# Patient Record
Sex: Female | Born: 1980 | Race: White | Hispanic: No | Marital: Single | Smoking: Current every day smoker
Health system: Southern US, Community
[De-identification: ages and names within clinical notes are randomized; demographics above are authoritative.]

## PROBLEM LIST (undated history)

## (undated) ENCOUNTER — Inpatient Hospital Stay: Payer: Self-pay

## (undated) DIAGNOSIS — R519 Headache, unspecified: Secondary | ICD-10-CM

## (undated) DIAGNOSIS — F329 Major depressive disorder, single episode, unspecified: Secondary | ICD-10-CM

## (undated) DIAGNOSIS — R51 Headache: Secondary | ICD-10-CM

## (undated) DIAGNOSIS — R87629 Unspecified abnormal cytological findings in specimens from vagina: Secondary | ICD-10-CM

## (undated) DIAGNOSIS — F32A Depression, unspecified: Secondary | ICD-10-CM

## (undated) DIAGNOSIS — E039 Hypothyroidism, unspecified: Secondary | ICD-10-CM

## (undated) HISTORY — PX: CHOLECYSTECTOMY: SHX55

---

## 2013-08-27 ENCOUNTER — Emergency Department: Payer: Self-pay | Admitting: Emergency Medicine

## 2013-08-31 LAB — WOUND AEROBIC CULTURE

## 2014-01-30 HISTORY — PX: MINI-LAPAROTOMY W/ TUBAL LIGATION: SUR811

## 2014-08-27 LAB — OB RESULTS CONSOLE VARICELLA ZOSTER ANTIBODY, IGG: Varicella: IMMUNE

## 2014-08-27 LAB — OB RESULTS CONSOLE HIV ANTIBODY (ROUTINE TESTING): HIV: NONREACTIVE

## 2014-08-27 LAB — OB RESULTS CONSOLE GC/CHLAMYDIA
CHLAMYDIA, DNA PROBE: NEGATIVE
CHLAMYDIA, DNA PROBE: NEGATIVE
CHLAMYDIA, DNA PROBE: NEGATIVE
GC PROBE AMP, GENITAL: NEGATIVE

## 2014-08-27 LAB — OB RESULTS CONSOLE ABO/RH: RH Type: POSITIVE

## 2014-08-27 LAB — OB RESULTS CONSOLE RUBELLA ANTIBODY, IGM: Rubella: IMMUNE

## 2014-08-27 LAB — OB RESULTS CONSOLE RPR: RPR: NONREACTIVE

## 2014-08-27 LAB — OB RESULTS CONSOLE HEPATITIS B SURFACE ANTIGEN: Hepatitis B Surface Ag: NEGATIVE

## 2014-08-27 LAB — OB RESULTS CONSOLE ANTIBODY SCREEN: Antibody Screen: NEGATIVE

## 2014-10-07 ENCOUNTER — Encounter: Payer: Self-pay | Admitting: *Deleted

## 2014-10-07 ENCOUNTER — Inpatient Hospital Stay
Admission: EM | Admit: 2014-10-07 | Discharge: 2014-10-07 | Disposition: A | Discharge status: Home / Self Care | Payer: Medicaid Other | Attending: Obstetrics and Gynecology | Admitting: Obstetrics and Gynecology

## 2014-10-07 DIAGNOSIS — Z3A23 23 weeks gestation of pregnancy: Secondary | ICD-10-CM | POA: Diagnosis not present

## 2014-10-07 DIAGNOSIS — O368120 Decreased fetal movements, second trimester, not applicable or unspecified: Secondary | ICD-10-CM | POA: Diagnosis present

## 2014-10-07 HISTORY — DX: Headache: R51

## 2014-10-07 HISTORY — DX: Headache, unspecified: R51.9

## 2014-10-07 HISTORY — DX: Unspecified abnormal cytological findings in specimens from vagina: R87.629

## 2014-10-07 NOTE — Progress Notes (Signed)
Pt arrived to OBS4 via wheelchair with complaints of decreased fetal movement all day. Pt reports feeling some movements but not a lot. Pt denies any leaking of fluid or vaginal bleeding.

## 2014-10-07 NOTE — Progress Notes (Signed)
Pt discharged home in stable and ambulatory condition with significant other. Pt given discharge instructions and instructed to follow up as soon as possible with the health department for ultrasound. Pt denies any questions or concerns.

## 2014-10-07 NOTE — OB Triage Note (Signed)
Follow up with [facility] department. Call tomorrow to schedule appt.

## 2014-10-07 NOTE — Progress Notes (Signed)
Dr Valentino Saxon notified of FHR tracing and fetal movement present now. Orders received for discharge home with instructions to follow up as soon as possible with [facility] for ultrasound and dating.

## 2014-10-22 ENCOUNTER — Other Ambulatory Visit: Payer: Self-pay | Admitting: Advanced Practice Midwife

## 2014-10-22 DIAGNOSIS — F17200 Nicotine dependence, unspecified, uncomplicated: Secondary | ICD-10-CM

## 2014-10-22 DIAGNOSIS — E039 Hypothyroidism, unspecified: Secondary | ICD-10-CM

## 2014-11-03 ENCOUNTER — Ambulatory Visit
Admission: RE | Admit: 2014-11-03 | Discharge: 2014-11-03 | Disposition: A | Discharge status: Home / Self Care | Payer: Medicaid Other | Source: Ambulatory Visit | Attending: Advanced Practice Midwife | Admitting: Advanced Practice Midwife

## 2014-11-03 ENCOUNTER — Ambulatory Visit
Admission: RE | Admit: 2014-11-03 | Discharge: 2014-11-03 | Disposition: A | Discharge status: Home / Self Care | Payer: Medicaid Other | Source: Ambulatory Visit | Attending: Obstetrics and Gynecology | Admitting: Obstetrics and Gynecology

## 2014-11-03 VITALS — BP 115/65 | HR 69 | Temp 98.0°F | Ht 65.0 in | Wt 165.0 lb

## 2014-11-03 DIAGNOSIS — B977 Papillomavirus as the cause of diseases classified elsewhere: Secondary | ICD-10-CM

## 2014-11-03 DIAGNOSIS — O99332 Smoking (tobacco) complicating pregnancy, second trimester: Secondary | ICD-10-CM | POA: Diagnosis present

## 2014-11-03 DIAGNOSIS — R8789 Other abnormal findings in specimens from female genital organs: Secondary | ICD-10-CM | POA: Diagnosis not present

## 2014-11-03 DIAGNOSIS — F329 Major depressive disorder, single episode, unspecified: Secondary | ICD-10-CM | POA: Insufficient documentation

## 2014-11-03 DIAGNOSIS — Z87898 Personal history of other specified conditions: Secondary | ICD-10-CM | POA: Insufficient documentation

## 2014-11-03 DIAGNOSIS — D696 Thrombocytopenia, unspecified: Secondary | ICD-10-CM | POA: Diagnosis not present

## 2014-11-03 DIAGNOSIS — Z3A26 26 weeks gestation of pregnancy: Secondary | ICD-10-CM | POA: Insufficient documentation

## 2014-11-03 DIAGNOSIS — E039 Hypothyroidism, unspecified: Secondary | ICD-10-CM

## 2014-11-03 DIAGNOSIS — F17200 Nicotine dependence, unspecified, uncomplicated: Secondary | ICD-10-CM

## 2014-11-03 DIAGNOSIS — Z720 Tobacco use: Secondary | ICD-10-CM

## 2014-11-03 DIAGNOSIS — IMO0002 Reserved for concepts with insufficient information to code with codable children: Secondary | ICD-10-CM

## 2014-11-03 HISTORY — DX: Major depressive disorder, single episode, unspecified: F32.9

## 2014-11-03 HISTORY — DX: Depression, unspecified: F32.A

## 2014-11-03 HISTORY — DX: Hypothyroidism, unspecified: E03.9

## 2014-11-03 MED ORDER — SERTRALINE HCL 25 MG PO TABS: 25.0000 mg | ORAL_TABLET | Freq: Every day | ORAL | Status: DC

## 2014-11-03 NOTE — Progress Notes (Signed)
[facility] Consultation   Chief Complaint: Advice regarding history of FGR in prior pregnancies and thrombocytopenia.  HPI: Rachel Baird is a 34 y.o. [phone] at 4325w4d by today's ultrasound (uncertain LMP) who presents in consultation from the [facility] for recommendations regarding history of FGR and thrombocytopenia.  She denies nose bleeds, gum bleeding, heavy menses or abnormalities with postpartum bleeding.  She does note easier bruising recently.  She is not taking any medications that are known to affect platelets.  Past Medical History: Patient  has a past medical history of Vaginal Pap smear, abnormal; Headache; Hypothyroidism; and Depression.  Past Surgical History: She  has past surgical history that includes Cholecystectomy.  Obstetric History:  OB History    Gravida Para Term Preterm AB TAB SAB Ectopic Multiple Living   4 3 3  0      3    2008 female 5#8oz [id number] female 6# 2013 female 5#9oz 2016 current FOB for the 2013 and 2016 pregnancies is different from the 2008 and 2010 pregnancies. Gynecologic History:  Patient's last menstrual period was 05/04/2014 (within months).  Hx of abnormal pap smears: yes Last pap smear was abnormal: ASCUS with +HPV .  She had colposcopy after her first abnormal PAP as a teen - she is scheduled for colpo PP.  No history of LEEPs or cone biopsies.  Medications: She takes PNVs. Allergies: Patient is allergic to minocycline.  Social History: Patient  reports that she has been smoking Cigarettes.  She has a 2.5 pack-year smoking history. She does not have any smokeless tobacco history on file. She reports that she uses illicit drugs (Marijuana). She reports that she does not drink alcohol. She is in a relationship with the father of this pregnancy and her most recent pregnancy.  She is not currently working.   Family History: family history includes COPD in her father and mother; Cancer in her father; Diabetes in her paternal grandfather  and paternal grandmother; Heart disease in her mother.  Review of Systems A full 12 point review of systems was negative or as noted in the History of Present Illness.  Physical Exam: BP 115/65 mmHg  Pulse 69  Temp(Src) 98 F (36.7 C)  Ht 5\' 5"  (1.651 m)  Wt 165 lb (74.844 kg)  BMI 27.46 kg/m2  LMP 05/04/2014 (Within Months)  Breastfeeding? Unknown   Asessement: 1. Thrombocytopenia (HCC)   2. Smoker   3. Hypothyroidism, unspecified hypothyroidism type   4. History of poor fetal growth   5. Abnormal cervical Pap smear with positive HPV DNA test   6.  Depression  Plan: 1.  Thrombocytopenia - will repeat cbc today.  If platelets remain low, consider further work up depending on level.  BP remains normal and patient denies any other symptoms concerning for ITP or preeclampsia.  Query gestational thrombocytopenia. 2.  Smoker - encouraged to try and cut back or quit.  Smoking aids available reviewed.  Discussed that smoking is probably the most common association with fetal growth restriction and can impact risk for stillbirth and/or SIDS. 3.  Hx of hypothyroidism as a teen - has been off synthroid for many years.  Intake TSH was normal. 4.  Hx of FGR x 2 - encouraged to cut back on smoking.  Recommend serial ultrasounds for growth (next scheduled in 4 weeks). 5.  Hx of abnormal PAP - colpo PP. 6.  Depression - patient hasn't been on meds (zoloft) since a teen.  When asked about her mood, she admitted  to being depressed.  She continues with self care and is able to care for her son, but mood is labile.  She denies SI or HI.  Has been on zoloft in the past and would like to restart.  Provided a script for 25 mg and instructed to double the dose in 1-2 weeks and if still no effect, to double again.  Will need to be reevaluated after she reaches 50-100mg  for effect.  We discussed that risk for depression was greatest postpartum.  She currently (per her records) does not have custody of her 2  oldest children and we did not discuss the issues or reasoning related to this.  Consider counseling.  Total time spent with the patient was 30 minutes with greater than 50% spent in counseling and coordination of care. We appreciate this interesting consult and will be happy to be involved in the ongoing care of Ms. Parsley in anyway her obstetricians desire.  Kirby Funk, MD Maternal-Fetal Medicine [place]

## 2014-11-03 NOTE — Addendum Note (Signed)
Encounter addended by: Kirby FunkSarah Alexandr Oehler, MD on: 11/03/2014  4:30 PM<BR>     Documentation filed: Notes Section

## 2014-11-03 NOTE — Progress Notes (Signed)
Remote history of gonorrhea in 2013 - treated.

## 2014-12-01 ENCOUNTER — Ambulatory Visit
Admission: RE | Admit: 2014-12-01 | Discharge: 2014-12-01 | Disposition: A | Discharge status: Home / Self Care | Payer: Medicaid Other | Source: Ambulatory Visit | Attending: Maternal & Fetal Medicine | Admitting: Maternal & Fetal Medicine

## 2014-12-01 ENCOUNTER — Other Ambulatory Visit: Payer: Self-pay | Admitting: Obstetrics and Gynecology

## 2014-12-01 VITALS — BP 125/67 | HR 65 | Temp 97.7°F | Wt 168.0 lb

## 2014-12-01 DIAGNOSIS — E039 Hypothyroidism, unspecified: Secondary | ICD-10-CM | POA: Insufficient documentation

## 2014-12-01 DIAGNOSIS — Z3A30 30 weeks gestation of pregnancy: Secondary | ICD-10-CM | POA: Diagnosis not present

## 2014-12-01 DIAGNOSIS — O99333 Smoking (tobacco) complicating pregnancy, third trimester: Secondary | ICD-10-CM | POA: Insufficient documentation

## 2014-12-01 DIAGNOSIS — O99283 Endocrine, nutritional and metabolic diseases complicating pregnancy, third trimester: Secondary | ICD-10-CM | POA: Insufficient documentation

## 2014-12-01 DIAGNOSIS — O99113 Other diseases of the blood and blood-forming organs and certain disorders involving the immune mechanism complicating pregnancy, third trimester: Secondary | ICD-10-CM | POA: Diagnosis not present

## 2014-12-01 DIAGNOSIS — O365930 Maternal care for other known or suspected poor fetal growth, third trimester, not applicable or unspecified: Secondary | ICD-10-CM | POA: Insufficient documentation

## 2014-12-01 DIAGNOSIS — D696 Thrombocytopenia, unspecified: Secondary | ICD-10-CM

## 2014-12-01 DIAGNOSIS — Z87898 Personal history of other specified conditions: Secondary | ICD-10-CM

## 2014-12-01 DIAGNOSIS — IMO0002 Reserved for concepts with insufficient information to code with codable children: Secondary | ICD-10-CM

## 2014-12-29 ENCOUNTER — Ambulatory Visit: Payer: Medicaid Other

## 2015-01-01 ENCOUNTER — Ambulatory Visit: Payer: Medicaid Other

## 2015-01-01 ENCOUNTER — Other Ambulatory Visit: Payer: Self-pay

## 2015-01-01 ENCOUNTER — Inpatient Hospital Stay (HOSPITAL_BASED_OUTPATIENT_CLINIC_OR_DEPARTMENT_OTHER)
Admission: RE | Admit: 2015-01-01 | Discharge: 2015-01-01 | Disposition: A | Discharge status: Home / Self Care | Payer: Medicaid Other | Source: Ambulatory Visit

## 2015-01-01 DIAGNOSIS — D696 Thrombocytopenia, unspecified: Secondary | ICD-10-CM | POA: Insufficient documentation

## 2015-01-01 DIAGNOSIS — Z8759 Personal history of other complications of pregnancy, childbirth and the puerperium: Secondary | ICD-10-CM

## 2015-01-01 DIAGNOSIS — Z87898 Personal history of other specified conditions: Secondary | ICD-10-CM

## 2015-01-01 NOTE — Progress Notes (Signed)
[facility]: Pt did not keep her appt with us today  Chandlar Staebell, [place] A, MD

## 2015-01-11 NOTE — L&D Delivery Note (Signed)
Delivery Note At 8:14 PM a viable female was delivered via Vaginal, Spontaneous Delivery (Presentation: direct OA ).  APGAR: 8, 9; weight  .   Placenta status: spontaneous, intact .  Cord: 3 vessels,  with the following complications: none apparent .  Cord pH:n/a  Anesthesia: Epidural  Episiotomy: None Lacerations: none Suture Repair: n/a Est. Blood Loss (mL):  200cc  Mom to postpartum.  Baby to Couplet care / Skin to Skin.  Patient was augmented with pitocin and progressed to complete, with fetal station at +3.  Patient pushed 2 small pushes, and fetal head was delivered with loose nuchal cord, reduced at perineum.  Body was delivered and placed on mom's chest.  No lacerations.  After >60 seconds of delayed cord clamping, the cord was doubly clamped and cut by FOB.  We sang happy birthday to [place].  The placenta was delivered spontaneously and intact.  Small periuretheral rug burn was noted, but was hemostatic and no suture needed. Baby recovered skin-to-skin on mom's chest.  Coriann Brouhard C 01/30/2015, 8:26 PM

## 2015-01-19 ENCOUNTER — Institutional Professional Consult (permissible substitution): Payer: Medicaid Other

## 2015-01-28 LAB — OB RESULTS CONSOLE GC/CHLAMYDIA
CHLAMYDIA, DNA PROBE: NEGATIVE
GC PROBE AMP, GENITAL: NEGATIVE

## 2015-01-30 ENCOUNTER — Inpatient Hospital Stay: Payer: Medicaid Other | Admitting: Anesthesiology

## 2015-01-30 ENCOUNTER — Encounter: Payer: Self-pay | Admitting: Obstetrics & Gynecology

## 2015-01-30 ENCOUNTER — Inpatient Hospital Stay
Admission: EM | Admit: 2015-01-30 | Discharge: 2015-02-02 | DRG: 767 | Disposition: A | Discharge status: Home / Self Care | Payer: Medicaid Other | Attending: Internal Medicine | Admitting: Internal Medicine

## 2015-01-30 DIAGNOSIS — O99334 Smoking (tobacco) complicating childbirth: Secondary | ICD-10-CM | POA: Diagnosis present

## 2015-01-30 DIAGNOSIS — F419 Anxiety disorder, unspecified: Secondary | ICD-10-CM | POA: Diagnosis present

## 2015-01-30 DIAGNOSIS — O99344 Other mental disorders complicating childbirth: Secondary | ICD-10-CM | POA: Diagnosis present

## 2015-01-30 DIAGNOSIS — F329 Major depressive disorder, single episode, unspecified: Secondary | ICD-10-CM | POA: Diagnosis present

## 2015-01-30 DIAGNOSIS — I10 Essential (primary) hypertension: Secondary | ICD-10-CM | POA: Diagnosis not present

## 2015-01-30 DIAGNOSIS — I973 Postprocedural hypertension: Secondary | ICD-10-CM | POA: Diagnosis not present

## 2015-01-30 DIAGNOSIS — F1290 Cannabis use, unspecified, uncomplicated: Secondary | ICD-10-CM | POA: Diagnosis present

## 2015-01-30 DIAGNOSIS — Z3A39 39 weeks gestation of pregnancy: Secondary | ICD-10-CM

## 2015-01-30 DIAGNOSIS — Z302 Encounter for sterilization: Secondary | ICD-10-CM

## 2015-01-30 DIAGNOSIS — R001 Bradycardia, unspecified: Secondary | ICD-10-CM | POA: Diagnosis not present

## 2015-01-30 DIAGNOSIS — O99324 Drug use complicating childbirth: Secondary | ICD-10-CM | POA: Diagnosis present

## 2015-01-30 DIAGNOSIS — D696 Thrombocytopenia, unspecified: Secondary | ICD-10-CM | POA: Diagnosis present

## 2015-01-30 DIAGNOSIS — O9912 Other diseases of the blood and blood-forming organs and certain disorders involving the immune mechanism complicating childbirth: Principal | ICD-10-CM | POA: Diagnosis present

## 2015-01-30 DIAGNOSIS — O99113 Other diseases of the blood and blood-forming organs and certain disorders involving the immune mechanism complicating pregnancy, third trimester: Secondary | ICD-10-CM | POA: Diagnosis present

## 2015-01-30 DIAGNOSIS — F17210 Nicotine dependence, cigarettes, uncomplicated: Secondary | ICD-10-CM | POA: Diagnosis present

## 2015-01-30 LAB — CBC
HEMATOCRIT: 37.9 % (ref 35.0–47.0)
HEMOGLOBIN: 12.3 g/dL (ref 12.0–16.0)
MCH: 28.2 pg (ref 26.0–34.0)
MCHC: 32.5 g/dL (ref 32.0–36.0)
MCV: 86.7 fL (ref 80.0–100.0)
Platelets: 115 10*3/uL — ABNORMAL LOW (ref 150–440)
RBC: 4.37 MIL/uL (ref 3.80–5.20)
RDW: 14 % (ref 11.5–14.5)
WBC: 14 10*3/uL — AB (ref 3.6–11.0)

## 2015-01-30 LAB — URINE DRUG SCREEN, QUALITATIVE (ARMC ONLY)
Amphetamines, Ur Screen: NOT DETECTED
BARBITURATES, UR SCREEN: NOT DETECTED
Benzodiazepine, Ur Scrn: NOT DETECTED
CANNABINOID 50 NG, UR ~~LOC~~: POSITIVE — AB
COCAINE METABOLITE, UR ~~LOC~~: NOT DETECTED
MDMA (ECSTASY) UR SCREEN: NOT DETECTED
Methadone Scn, Ur: NOT DETECTED
OPIATE, UR SCREEN: NOT DETECTED
PHENCYCLIDINE (PCP) UR S: NOT DETECTED
TRICYCLIC, UR SCREEN: NOT DETECTED

## 2015-01-30 LAB — TYPE AND SCREEN
ABO/RH(D): A POS
Antibody Screen: NEGATIVE

## 2015-01-30 LAB — ABO/RH: ABO/RH(D): A POS

## 2015-01-30 LAB — MRSA PCR SCREENING: MRSA BY PCR: NEGATIVE

## 2015-01-30 MED ORDER — PHENYLEPHRINE 40 MCG/ML (10ML) SYRINGE FOR IV PUSH (FOR BLOOD PRESSURE SUPPORT)
80.0000 ug | PREFILLED_SYRINGE | INTRAVENOUS | Status: DC | PRN
  Filled 2015-01-30: qty 2

## 2015-01-30 MED ORDER — OXYTOCIN 40 UNITS IN LACTATED RINGERS INFUSION - SIMPLE MED
1.0000 m[IU]/min | INTRAVENOUS | Status: DC
  Administered 2015-01-30: 1 m[IU]/min via INTRAVENOUS

## 2015-01-30 MED ORDER — LACTATED RINGERS IV SOLN
INTRAVENOUS | Status: DC
  Administered 2015-01-30 (×2): via INTRAVENOUS

## 2015-01-30 MED ORDER — LIDOCAINE-EPINEPHRINE (PF) 1.5 %-1:200000 IJ SOLN
INTRAMUSCULAR | Status: DC | PRN
  Administered 2015-01-30: 3 mL via EPIDURAL

## 2015-01-30 MED ORDER — CITRIC ACID-SODIUM CITRATE 334-500 MG/5ML PO SOLN: 30.0000 mL | ORAL | Status: DC | PRN

## 2015-01-30 MED ORDER — ONDANSETRON HCL 4 MG/2ML IJ SOLN: 4.0000 mg | INTRAMUSCULAR | Status: DC | PRN

## 2015-01-30 MED ORDER — BUPIVACAINE HCL (PF) 0.25 % IJ SOLN
INTRAMUSCULAR | Status: DC | PRN
  Administered 2015-01-30: 5 mL via EPIDURAL

## 2015-01-30 MED ORDER — DIBUCAINE 1 % RE OINT: 1.0000 "application " | TOPICAL_OINTMENT | RECTAL | Status: DC | PRN

## 2015-01-30 MED ORDER — LACTATED RINGERS IV SOLN: 500.0000 mL | INTRAVENOUS | Status: DC | PRN

## 2015-01-30 MED ORDER — OXYTOCIN 40 UNITS IN LACTATED RINGERS INFUSION - SIMPLE MED
2.5000 [IU]/h | INTRAVENOUS | Status: DC
  Administered 2015-01-30: 39.96 [IU]/h via INTRAVENOUS
  Filled 2015-01-30: qty 1000

## 2015-01-30 MED ORDER — DIPHENHYDRAMINE HCL 25 MG PO CAPS: 25.0000 mg | ORAL_CAPSULE | Freq: Four times a day (QID) | ORAL | Status: DC | PRN

## 2015-01-30 MED ORDER — LANOLIN HYDROUS EX OINT: TOPICAL_OINTMENT | CUTANEOUS | Status: DC | PRN

## 2015-01-30 MED ORDER — LIDOCAINE HCL (PF) 1 % IJ SOLN
30.0000 mL | INTRAMUSCULAR | Status: DC | PRN
  Filled 2015-01-30: qty 30

## 2015-01-30 MED ORDER — OXYCODONE-ACETAMINOPHEN 5-325 MG PO TABS: 1.0000 | ORAL_TABLET | ORAL | Status: DC | PRN

## 2015-01-30 MED ORDER — DIPHENHYDRAMINE HCL 50 MG/ML IJ SOLN: 12.5000 mg | INTRAMUSCULAR | Status: DC | PRN

## 2015-01-30 MED ORDER — WITCH HAZEL-GLYCERIN EX PADS: 1.0000 "application " | MEDICATED_PAD | CUTANEOUS | Status: DC | PRN

## 2015-01-30 MED ORDER — ACETAMINOPHEN 325 MG PO TABS: 650.0000 mg | ORAL_TABLET | ORAL | Status: DC | PRN

## 2015-01-30 MED ORDER — EPHEDRINE 5 MG/ML INJ
10.0000 mg | INTRAVENOUS | Status: DC | PRN
  Filled 2015-01-30: qty 2

## 2015-01-30 MED ORDER — FENTANYL 2.5 MCG/ML W/ROPIVACAINE 0.2% IN NS 100 ML EPIDURAL INFUSION (ARMC-ANES): 14.0000 mL/h | EPIDURAL | Status: DC

## 2015-01-30 MED ORDER — TERBUTALINE SULFATE 1 MG/ML IJ SOLN: 0.2500 mg | Freq: Once | INTRAMUSCULAR | Status: DC | PRN

## 2015-01-30 MED ORDER — ONDANSETRON HCL 4 MG/2ML IJ SOLN: 4.0000 mg | Freq: Four times a day (QID) | INTRAMUSCULAR | Status: DC | PRN

## 2015-01-30 MED ORDER — OXYTOCIN 40 UNITS IN LACTATED RINGERS INFUSION - SIMPLE MED: 1.0000 m[IU]/min | INTRAVENOUS | Status: DC

## 2015-01-30 MED ORDER — ACETAMINOPHEN 500 MG PO TABS
1000.0000 mg | ORAL_TABLET | Freq: Four times a day (QID) | ORAL | Status: DC | PRN
  Administered 2015-01-31 (×2): 1000 mg via ORAL
  Filled 2015-01-30 (×2): qty 2

## 2015-01-30 MED ORDER — MISOPROSTOL 25 MCG QUARTER TABLET
25.0000 ug | ORAL_TABLET | ORAL | Status: DC | PRN
Start: 2015-01-30 — End: 2015-01-31
  Filled 2015-01-30: qty 1

## 2015-01-30 MED ORDER — DOCUSATE SODIUM 100 MG PO CAPS
100.0000 mg | ORAL_CAPSULE | Freq: Two times a day (BID) | ORAL | Status: DC
  Administered 2015-01-31 – 2015-02-02 (×5): 100 mg via ORAL
  Filled 2015-01-30 (×5): qty 1

## 2015-01-30 MED ORDER — FENTANYL 2.5 MCG/ML W/ROPIVACAINE 0.2% IN NS 100 ML EPIDURAL INFUSION (ARMC-ANES)
EPIDURAL | Status: AC
  Administered 2015-01-30: 9 mL/h via EPIDURAL
  Filled 2015-01-30: qty 100

## 2015-01-30 MED ORDER — ONDANSETRON HCL 4 MG PO TABS: 4.0000 mg | ORAL_TABLET | ORAL | Status: DC | PRN

## 2015-01-30 MED ORDER — SIMETHICONE 80 MG PO CHEW: 80.0000 mg | CHEWABLE_TABLET | ORAL | Status: DC | PRN

## 2015-01-30 MED ORDER — PRENATAL MULTIVITAMIN CH
1.0000 | ORAL_TABLET | Freq: Every day | ORAL | Status: DC
  Administered 2015-02-01: 1 via ORAL
  Filled 2015-01-30 (×2): qty 1

## 2015-01-30 MED ORDER — OXYTOCIN BOLUS FROM INFUSION: 500.0000 mL | INTRAVENOUS | Status: DC

## 2015-01-30 MED ORDER — MISOPROSTOL 25 MCG QUARTER TABLET
ORAL_TABLET | ORAL | Status: AC
  Administered 2015-01-30: 25 ug
  Filled 2015-01-30: qty 0.25

## 2015-01-30 MED ORDER — TETANUS-DIPHTH-ACELL PERTUSSIS 5-2.5-18.5 LF-MCG/0.5 IM SUSP: 0.5000 mL | Freq: Once | INTRAMUSCULAR | Status: DC

## 2015-01-30 NOTE — Progress Notes (Signed)
Intrapartum progress note  S: patient comfortable following epidural.    O:  FHT: 140 mod + accels, 1 decel gradual to 90 over 1 min and lasting for 1 min with rapid return to baseline during foley catheter procedure- pt turned to right side, moderate variability. TOCO: q 4-8 min   A/P: 34yo [id number] @ 39.2 with IOL for gestational thrombocytopenia, poor prenatal care, as recommended by MFM.  1. IUP: Category 1 strip, cephalic 2. IOL: s/p cytotec x1, with rapid progression from 1cm to 4cm in 2hrs.  S/p AROM.  Expectant management for now.  Pitocin augmentation PRN. 3. GBS still pending - no risk factors identified; no antibiotics for now.  If results return positive, will notify peds and treat PRN. 4. Pain control:  Epidural  ----- Tresea Mall, CNM   This patient and plan were discussed with Dr Elesa Massed 01/30/2015

## 2015-01-30 NOTE — Plan of Care (Signed)
GBS unknown. No order received for GBS treatment. Order received for epidural. Dr Henrene Hawking notified

## 2015-01-30 NOTE — Anesthesia Preprocedure Evaluation (Addendum)
Anesthesia Evaluation  Patient identified by MRN, date of birth, ID band Patient awake    Reviewed: Allergy & Precautions, H&P , NPO status , Patient's Chart, lab work & pertinent test results, reviewed documented beta blocker date and time   Airway Mallampati: III  TM Distance: >3 FB Neck ROM: full    Dental no notable dental hx. (+) Teeth Intact   Pulmonary neg pulmonary ROS, Current Smoker,    Pulmonary exam normal breath sounds clear to auscultation       Cardiovascular Exercise Tolerance: Good negative cardio ROS Normal cardiovascular exam Rhythm:regular Rate:Normal     Neuro/Psych  Headaches, PSYCHIATRIC DISORDERS negative neurological ROS  negative psych ROS   GI/Hepatic negative GI ROS, Neg liver ROS,   Endo/Other  negative endocrine ROSHypothyroidism   Renal/GU negative Renal ROS  negative genitourinary   Musculoskeletal   Abdominal   Peds  Hematology negative hematology ROS (+)   Anesthesia Other Findings   Reproductive/Obstetrics (+) Pregnancy                            Anesthesia Physical Anesthesia Plan  ASA: II  Anesthesia Plan: Regional and Epidural   Post-op Pain Management:    Induction:   Airway Management Planned:   Additional Equipment:   Intra-op Plan:   Post-operative Plan:   Informed Consent: I have reviewed the patients History and Physical, chart, labs and discussed the procedure including the risks, benefits and alternatives for the proposed anesthesia with the patient or authorized representative who has indicated his/her understanding and acceptance.     Plan Discussed with: CRNA  Anesthesia Plan Comments:         Anesthesia Quick Evaluation

## 2015-01-30 NOTE — Progress Notes (Signed)
Intrapartum progress note  S: patient comfortable after epidural.  No complaints  O: BP 104/49 mmHg  Pulse 59  Temp(Src) 98.7 F (37.1 C) (Oral)  Resp 18  SpO2 97%  \  FHT: 145 mod + accels + earlys TOCO: q 2-3 pit at 2 SVE: 7/100/0 blood show  A/P:  34yo G4P3003 @ 39.2 with IOL, now in labor   1. IUP: category 1 strip 2. IOL: successful, augmented with pitocin, s/p cytotec, AROM 3. Desires permanent sterilization - will notify OR and Dr. Vergie Living after vaginal delivery. 4. Anticipate vaginal delivery  ----- Ranae Plumber, MD Attending Obstetrician and Gynecologist [facility]

## 2015-01-30 NOTE — Anesthesia Procedure Notes (Signed)
Epidural Patient location during procedure: OB Start time: 01/30/2015 4:50 PM End time: 01/30/2015 4:57 PM  Staffing Anesthesiologist: Yevette Edwards Resident/CRNA: Mathews Argyle Performed by: resident/CRNA   Preanesthetic Checklist Completed: patient identified, site marked, surgical consent, pre-op evaluation, timeout performed, IV checked, risks and benefits discussed and monitors and equipment checked  Epidural Patient position: sitting Prep: Betadine and site prepped and draped Patient monitoring: heart rate, continuous pulse ox and blood pressure Approach: midline Location: L4-L5 Injection technique: LOR saline  Needle:  Needle type: Tuohy  Needle gauge: 17 G Needle length: 9 cm and 9 Needle insertion depth: 7 cm Catheter type: closed end flexible Catheter size: 19 Gauge Catheter at skin depth: 12 cm Test dose: negative and 1.5% lidocaine with Epi 1:200 K  Assessment Events: blood not aspirated, injection not painful, no injection resistance, negative IV test and no paresthesia  Additional Notes   Patient tolerated the insertion well without complications.Reason for block:procedure for pain

## 2015-01-30 NOTE — Discharge Summary (Signed)
Obstetrical Discharge Summary  Patient Name: Rachel Baird DOB: Jul 22, 1980 MRN: [id number]  Date of Admission: 01/30/2015 Date of Discharge: 02/02/2015  Primary OB: [facility]  Gestational Age at Delivery: [redacted]w[redacted]d   Antepartum complications:  1. MRSA Carrier 2. Depression/Anxiety taking Zoloft /Therapy with LCSW 3. Abnormal PAP ASCUS with Positive HPV 4. Positive Urine Drug Screen Marijuana 12/21/14 5. History of Low Birth Weight Infant x2, 2008, 2013 6. History of Hypothyroidism- no medication currently 7. Lost custody of 2 oldest children 8. Cigarette Smoker 1/2 to 3/4 PPD    Admitting Diagnosis: induction of labor Secondary Diagnosis: Hypertension, Bradycardia following Bilateral Tubal Ligation  Patient Active Problem List   Diagnosis Date Noted  . Postpartum care following vaginal delivery 02/02/2015  . Bradycardia on ECG 02/01/2015  . Labor and delivery, indication for care 01/30/2015  . Thrombocytopenia (HCC) 01/01/2015  . Abnormal cervical Pap smear with positive HPV DNA test 11/03/2014    Augmentation: AROM, Pitocin and Cytotec Complications: None Intrapartum complications/course: IOL scheduled for AM, was given cytotec x1, then AROM at 4cm, contractions spaced out at 5cm, added pitocin, and progressed quickly to complete, with rapid descent.   Date of Delivery: 01/30/15 Delivered By: Leeroy Bock Ward Delivery Type: spontaneous vaginal delivery Anesthesia: epidural Placenta: sponatneous Laceration: none Episiotomy: none Newborn Data: Live born female  Birth Weight:  2780g APGAR: 8, 9    Discharge Physical Exam:  BP 122/66 mmHg  Pulse 51  Temp(Src) 97.9 F (36.6 C) (Oral)  Resp 18  SpO2 98%  LMP 04/30/2014  Formula Feeding  General: NAD CV: RRR Pulm: CTABL, nl effort ABD: s/nd/nt, fundus firm and below the umbilicus Lochia: moderate DVT Evaluation: LE non-ttp, no evidence of DVT on exam.  HEMOGLOBIN  Date Value Ref Range Status  02/01/2015 11.4* 12.0  - 16.0 g/dL Final   HCT  Date Value Ref Range Status  02/01/2015 34.4* 35.0 - 47.0 % Final    Post partum course: Hydralazine 10 mg TID ordered initially for Hypertension/Bradycardia following BTL. Consult with Medicine with dose adjustment of 25 mg BID. ECHO on 02/02/15 Normal. Consult with Dr Hilton Sinclair 02/02/15 review of today's vital signs with dose adjustment of Hydralazine to 10 mg BID, Discharge to home and follow up in 1 week for BP and Pulse. Positive UDS Marijuana on admission. Baby with positive Marijuana. See CSW note.  Postpartum Procedures: PP BTL on 01/31/2015  Disposition: stable, discharge to home. Baby Feeding: formula Baby Disposition: home with mom  Rh Immune globulin given: no Rubella vaccine given:  no Tdap vaccine given in AP or PP setting: antepartum Flu vaccine given in AP or PP setting: antepartum  Contraception: PPTL  Prenatal Labs:  Blood type/Rh A positive  Antibody screen negative  Rubella Immune  Varicella Immune    RPR Non Reactive  HBsAg negative  HIV negative  GC negative  Chlamydia negative  Genetic screening unknown  1 hour GTT 102  3 hour GTT NA  GBS Pending results          Plan:  Sheilia Reznick was discharged to home in good condition. Follow-up appointment at the [facility] in 6 weeks for routine post partum check   Discharge Medications:   Medication List    STOP taking these medications        acetaminophen 325 MG tablet  Commonly known as:  TYLENOL      TAKE these medications        hydrALAZINE 10 MG tablet  Commonly known as:  APRESOLINE  Take  1 tablet (10 mg total) by mouth 2 (two) times daily.     prenatal multivitamin Tabs tablet  Take 1 tablet by mouth daily at 12 noon.     sertraline 100 MG tablet  Commonly known as:  ZOLOFT  Take 1 tablet (100 mg total) by mouth daily.        Follow-up Information    Follow up with [facility] Department In 1 week.   Why:  blood  pressure and pulse check   Contact information:   [address] [place] [address] [phone]       Follow up with [facility] Department In 6 weeks.   Contact information:   [address] [place] [address] [phone]       Tresea Mall, CNM   This patient and plan were discussed with Dr Jean Rosenthal 02/02/2015

## 2015-01-30 NOTE — H&P (Signed)
OB History & Physical   History of Present Illness:  Chief Complaint: Thrombocytopenia Gestational vs Idiopathic  Secondary Diagnoses: 1. MRSA Carrier 2. Depression/Anxiety taking Zoloft /Therapy with LCSW 3. Abnormal PAP ASCUS with Positive HPV 4. Positive Urine Drug Screen Marijuana 12/21/14 5. History of Low Birth Weight Infant x2, 2008, 2013 6. History of Hypothyroidism- no medication currently 7. Lost custody of 2 oldest children 8. Cigarette Smoker 1/2 to 3/4 PPD   HPI:  Rachel Baird is a 35 y.o. G5P3003 female at [redacted]w[redacted]d dated by LMP.  Her pregnancy has been complicated by Thrombocytopenia, Depression, Marijuana use, Cigarette Smoking.    She reports Braxton Hicks contractions.   She denies leakage of fluid.   She denies vaginal bleeding.   She reports fetal movement.    Maternal Medical History:   Past Medical History  Diagnosis Date  . Vaginal Pap smear, abnormal   . Headache   . Hypothyroidism     d/ced synthroid age 67; had normal TFTs 08/2014  . Depression     self d/ced zoloft age 58    Past Surgical History  Procedure Laterality Date  . Cholecystectomy      Allergies  Allergen Reactions  . Minocycline Swelling    Palms hand and soles of feet red and burning    Prior to Admission medications   Medication Sig Start Date End Date Taking? Authorizing Provider  acetaminophen (TYLENOL) 325 MG tablet Take 650 mg by mouth every 6 (six) hours as needed for headache (pt takes occaisionally).    Historical Provider, MD  Prenatal Vit-Fe Fumarate-FA (PRENATAL MULTIVITAMIN) TABS tablet Take 1 tablet by mouth daily at 12 noon.    Historical Provider, MD  sertraline (ZOLOFT) 25 MG tablet Take 1 tablet (25 mg total) by mouth daily. Patient taking differently: Take 100 mg by mouth daily.  11/03/14   Kirby Funk, MD    OB History  Gravida Para Term Preterm AB SAB TAB Ectopic Multiple Living  0      3    # Outcome Date GA Lbr Len/2nd Weight Sex Delivery  Anes PTL Lv  5 Current           4 Term 2013 [redacted]w[redacted]d  2.523 kg (5 lb 9 oz)  Vag-Spont   Y  3 Term 2010 [redacted]w[redacted]d  2.722 kg (6 lb)  Vag-Spont   Y  2 Term 2008 [redacted]w[redacted]d  2.495 kg (5 lb 8 oz)  Vag-Spont   Y  1 Gravida               Prenatal care site: [facility]  Social History: She  reports that she has been smoking Cigarettes.  She has a 2.5 pack-year smoking history. She does not have any smokeless tobacco history on file. She reports that she uses illicit drugs (Marijuana). She reports that she does not drink alcohol.  Family History: family history includes COPD in her father and mother; Cancer in her father; Diabetes in her paternal grandfather and paternal grandmother; Heart disease in her mother.   Review of Systems: Negative x 10 systems reviewed except as noted in the HPI.    Physical Exam:  Vital Signs: Temp(Src) 98 F (36.7 C)  LMP 04/30/2014 General: no acute distress.  HEENT: normocephalic, atraumatic Heart: regular rate & rhythm.  No murmurs/rubs/gallops Lungs: clear to auscultation bilaterally Abdomen: soft, gravid, non-tender;  EFW: 6 pounds Pelvic:   External: Normal external female genitalia  Cervix: Dilation: 1.5 / Effacement (%): 70 /  Station: -2   Extremities: non-tender, symmetric, no edema bilaterally.  DTRs: 2+ Bilaterally  Neurologic: Alert & oriented x 3.    Pertinent Results:  Prenatal Labs: Blood type/Rh A positive  Antibody screen negative  Rubella Immune  Varicella Immune    RPR Non Reactive  HBsAg negative  HIV negative  GC negative  Chlamydia negative  Genetic screening unknown  1 hour GTT 102  3 hour GTT NA  GBS Pending results   Baseline FHR: 150 beats/min   Variability: moderate   Accelerations: present   Decelerations: absent Contractions: present frequency: 2-5 min Overall assessment: Cat I tracing   Assessment:  [name] is a 35 y.o. G45P3003 female at [redacted]w[redacted]d with Induction of Labor for Thrombocytopenia.   Plan:  1. Admit to Labor &  Delivery  2. CBC, T&S, Clrs, IVF 3. GBS pending results.   4. Fetal well-being: Cat I 5. Cytotec for induction 6. MRSA swab for unacceptable previous specimen  [place]iana Gamero, CNM  This patient and plan were discussed with Dr Elesa Massed 01/30/2015

## 2015-01-30 NOTE — Progress Notes (Addendum)
Intrapartum progress note  S: patient having increasingly painful contractions.    O:  FHT: 130 mod + accels no decels TOCO: q 3-4 min SVE: 4/80/-1  AROM for blood-tinged clear fluid   A/P: 34yo [id number] @ 39.2 with IOL for gestational thrombocytopenia, poor prenatal care, as recommended by MFM.  1. IUP: Category 1 strip, cephalic 2. IOL: s/p cytotec x1, with rapid progression from 1cm to 4cm in 2hrs.  S/p AROM.  Expectant management for now.  jPitocin augmentation PRN. 3. GBS still pending - no risk factors identified; no antibiotics for now.  If results return positive, will notify peds and treat PRN. 4. Pain control:  Due to progression, I recommend no IV drugs, rather an epidural.  Patient on board, will notify anesthesia.   ----- Ranae Plumber, MD Attending Obstetrician and Gynecologist [facility]

## 2015-01-31 ENCOUNTER — Inpatient Hospital Stay: Payer: Medicaid Other | Admitting: Certified Registered"

## 2015-01-31 ENCOUNTER — Encounter: Payer: Self-pay | Admitting: Anesthesiology

## 2015-01-31 ENCOUNTER — Encounter
Admission: EM | Disposition: A | Discharge status: Home / Self Care | Payer: Self-pay | Source: Home / Self Care | Attending: Obstetrics & Gynecology

## 2015-01-31 HISTORY — PX: TUBAL LIGATION: SHX77

## 2015-01-31 LAB — RPR: RPR: NONREACTIVE

## 2015-01-31 LAB — CBC
HEMATOCRIT: 34.3 % — AB (ref 35.0–47.0)
Hemoglobin: 11.1 g/dL — ABNORMAL LOW (ref 12.0–16.0)
MCH: 28.2 pg (ref 26.0–34.0)
MCHC: 32.4 g/dL (ref 32.0–36.0)
MCV: 86.9 fL (ref 80.0–100.0)
Platelets: 101 10*3/uL — ABNORMAL LOW (ref 150–440)
RBC: 3.95 MIL/uL (ref 3.80–5.20)
RDW: 13.9 % (ref 11.5–14.5)
WBC: 13.2 10*3/uL — AB (ref 3.6–11.0)

## 2015-01-31 SURGERY — LIGATION, FALLOPIAN TUBE, BILATERAL
Anesthesia: General | Laterality: Bilateral

## 2015-01-31 MED ORDER — SERTRALINE HCL 100 MG PO TABS
100.0000 mg | ORAL_TABLET | Freq: Every day | ORAL | Status: DC
  Administered 2015-02-01 – 2015-02-02 (×2): 100 mg via ORAL
  Filled 2015-01-31 (×2): qty 1

## 2015-01-31 MED ORDER — ONDANSETRON HCL 4 MG/2ML IJ SOLN
INTRAMUSCULAR | Status: DC | PRN
  Administered 2015-01-31: 4 mg via INTRAVENOUS

## 2015-01-31 MED ORDER — PROMETHAZINE HCL 25 MG/ML IJ SOLN: 6.2500 mg | INTRAMUSCULAR | Status: DC | PRN

## 2015-01-31 MED ORDER — MIDAZOLAM HCL 2 MG/2ML IJ SOLN
INTRAMUSCULAR | Status: DC | PRN
  Administered 2015-01-31: 2 mg via INTRAVENOUS

## 2015-01-31 MED ORDER — LACTATED RINGERS IV SOLN
INTRAVENOUS | Status: DC | PRN
  Administered 2015-01-31: 12:00:00 via INTRAVENOUS

## 2015-01-31 MED ORDER — FENTANYL CITRATE (PF) 100 MCG/2ML IJ SOLN
INTRAMUSCULAR | Status: AC
  Filled 2015-01-31: qty 2

## 2015-01-31 MED ORDER — BUPIVACAINE HCL 0.5 % IJ SOLN
INTRAMUSCULAR | Status: DC | PRN
  Administered 2015-01-31: 10 mL

## 2015-01-31 MED ORDER — SUCCINYLCHOLINE CHLORIDE 20 MG/ML IJ SOLN
INTRAMUSCULAR | Status: DC | PRN
  Administered 2015-01-31: 80 mg via INTRAVENOUS

## 2015-01-31 MED ORDER — LIDOCAINE 5 % EX PTCH
1.0000 | MEDICATED_PATCH | CUTANEOUS | Status: DC
  Administered 2015-01-31: 1 via TRANSDERMAL
  Filled 2015-01-31: qty 1

## 2015-01-31 MED ORDER — SERTRALINE HCL 100 MG PO TABS: 100.0000 mg | ORAL_TABLET | Freq: Every day | ORAL | Status: DC

## 2015-01-31 MED ORDER — ONDANSETRON HCL 4 MG/2ML IJ SOLN
4.0000 mg | Freq: Four times a day (QID) | INTRAMUSCULAR | Status: DC | PRN
  Administered 2015-01-31: 4 mg via INTRAVENOUS
  Filled 2015-01-31: qty 2

## 2015-01-31 MED ORDER — FENTANYL CITRATE (PF) 100 MCG/2ML IJ SOLN
INTRAMUSCULAR | Status: DC | PRN
  Administered 2015-01-31: 100 ug via INTRAVENOUS

## 2015-01-31 MED ORDER — FENTANYL CITRATE (PF) 100 MCG/2ML IJ SOLN
25.0000 ug | INTRAMUSCULAR | Status: DC | PRN
  Administered 2015-01-31 (×5): 25 ug via INTRAVENOUS

## 2015-01-31 MED ORDER — BUPIVACAINE HCL (PF) 0.5 % IJ SOLN
INTRAMUSCULAR | Status: AC
  Filled 2015-01-31: qty 30

## 2015-01-31 MED ORDER — PROPOFOL 10 MG/ML IV BOLUS
INTRAVENOUS | Status: DC | PRN
Start: 2015-01-31 — End: 2015-01-31
  Administered 2015-01-31: 140 mg via INTRAVENOUS

## 2015-01-31 MED ORDER — OXYCODONE-ACETAMINOPHEN 5-325 MG PO TABS
1.0000 | ORAL_TABLET | ORAL | Status: DC | PRN
  Administered 2015-01-31 – 2015-02-02 (×11): 2 via ORAL
  Filled 2015-01-31 (×11): qty 2

## 2015-01-31 SURGICAL SUPPLY — 27 items
CHLORAPREP W/TINT 26ML (MISCELLANEOUS) ×3 IMPLANT
DRAPE LAPAROTOMY 100X77 ABD (DRAPES) ×3 IMPLANT
DRESSING TELFA 4X3 1S ST N-ADH (GAUZE/BANDAGES/DRESSINGS) IMPLANT
DRSG TEGADERM 2-3/8X2-3/4 SM (GAUZE/BANDAGES/DRESSINGS) IMPLANT
ELECT CAUTERY BLADE 6.4 (BLADE) ×3 IMPLANT
ELECT REM PT RETURN 9FT ADLT (ELECTROSURGICAL) ×3
ELECTRODE REM PT RTRN 9FT ADLT (ELECTROSURGICAL) ×1 IMPLANT
GLOVE BIO SURGEON STRL SZ7 (GLOVE) ×9 IMPLANT
GLOVE INDICATOR 7.5 STRL GRN (GLOVE) ×3 IMPLANT
GOWN STRL REUS W/ TWL LRG LVL3 (GOWN DISPOSABLE) ×1 IMPLANT
GOWN STRL REUS W/ TWL XL LVL3 (GOWN DISPOSABLE) ×1 IMPLANT
GOWN STRL REUS W/TWL LRG LVL3 (GOWN DISPOSABLE) ×2
GOWN STRL REUS W/TWL XL LVL3 (GOWN DISPOSABLE) ×2
KIT RM TURNOVER CYSTO AR (KITS) ×3 IMPLANT
LABEL OR SOLS (LABEL) ×3 IMPLANT
LIQUID BAND (GAUZE/BANDAGES/DRESSINGS) ×3 IMPLANT
NDL SAFETY 22GX1.5 (NEEDLE) ×3 IMPLANT
NS IRRIG 500ML POUR BTL (IV SOLUTION) ×3 IMPLANT
PACK BASIN MINOR ARMC (MISCELLANEOUS) ×3 IMPLANT
SPONGE LAP 18X18 5 PK (GAUZE/BANDAGES/DRESSINGS) ×3 IMPLANT
SUT PLAIN 2 0 (SUTURE) ×2
SUT PLAIN ABS 2-0 CT1 27XMFL (SUTURE) ×1 IMPLANT
SUT VIC AB 0 SH 27 (SUTURE) ×6 IMPLANT
SUT VIC AB 2-0 UR6 27 (SUTURE) ×3 IMPLANT
SUT VIC AB 4-0 PS2 18 (SUTURE) ×3 IMPLANT
SUT VICRYL 0 TIES 12 18 (SUTURE) ×3 IMPLANT
SYRINGE 10CC LL (SYRINGE) ×3 IMPLANT

## 2015-01-31 NOTE — Transfer of Care (Signed)
Immediate Anesthesia Transfer of Care Note  Patient: Rachel Baird  Procedure(s) Performed: Procedure(s): BILATERAL TUBAL LIGATION (Bilateral)  Patient Location: PACU  Anesthesia Type:General  Level of Consciousness: awake and alert   Airway & Oxygen Therapy: Patient connected to face mask oxygen  Post-op Assessment: Report given to RN  Post vital signs: stable  Last Vitals:  Filed Vitals:   01/31/15 1038 01/31/15 1301  BP: 111/65 136/65  Pulse: 48 70  Temp: 36.7 C 36 C  Resp: 18 16    Complications: No apparent anesthesia complications

## 2015-01-31 NOTE — Op Note (Signed)
Operative Note   11/02/2014  PRE-OP DIAGNOSIS: Desire for permanent sterilization. Postpartum Day #1   POST-OP DIAGNOSIS: Same  SURGEON: Surgeon(s) and Role:    * [place] La[place] Bing, MD - Primary  ASSISTANT: None  ANESTHESIA: General and local  PROCEDURE: mini-laparotomy, bilateral tubal ligation via Modified Pomeroy method  ESTIMATED BLOOD LOSS: 10mL  DRAINS: None  TOTAL IV FLUIDS: crystalloid  SPECIMENS:  Portions of bilateral tubes to pathology  VTE PROPHYLAXIS: not indicated  ANTIBIOTICS: not indicated  COMPLICATIONS: none  DISPOSITION: PACU - hemodynamically stable.  CONDITION: stable  FINDINGS: No intra-abdominal adhesions noted. Smooth, normally contoured uterine fundus and bilateral tubes. Normal appearing tubes and normal ovaries on palpation. +lumens noted on each cross section  PROCEDURE IN DETAIL: The patient was taken to the OR where anesthesia was administed. The patient was positioned in dorsal supine. The patient was prepped and draped in the normal sterile fashion a 4cm horizontal incision was made approximately 2 finger breadths below the umbilicus, after injection with local anesthesia. The skin was then incised with the scalpel and the underlying tissue dissected with the bovie and the fascia nicked in the midline with the scalpel and then extended laterally sharply.  The abdomen was then entered bluntly and a moist lap sponge used to displace the bowel. The left Fallopian tube was palpated and then traced to it's fimbriae and an avascular midsection of the tube approximately 3-4cm from the cornua was grasped with the Babcock clamps and brought into a knuckle. The tube was double ligated with one 2-0 plain gut suture and the intervening portion of tube was transected and removed; prior to removal, another free tie of 2-0 plain gut was tied below the first suture.  Attention was then turned to the right fallopian tube, after confirmation of identification by  tracing the tube out to the fimbriae. The same procedure was then performed on the right Fallopian tube. The lap sponge was then removed from the abdomen and the fascia closed in running fashion with 0 vicryl and the subcutaneous tissue irrigated and closed with interrupted sutures of 2-0 plain gut. The skin was then closed with 4-0 vicryl and liquiband.   The patient tolerated the procedure well. All counts were correct x 2. The patient was transferred to the recovery room awake, alert and breathing independently.   Cornelia Copa MD [facility]  Pager: [phone]

## 2015-01-31 NOTE — Anesthesia Postprocedure Evaluation (Signed)
Anesthesia Post Note  Patient: Rachel Baird  Procedure(s) Performed: * No procedures listed *  Patient location during evaluation: PACU Anesthesia Type: Epidural Level of consciousness: awake and alert Pain management: satisfactory to patient Vital Signs Assessment: post-procedure vital signs reviewed and stable Respiratory status: spontaneous breathing, nonlabored ventilation, respiratory function stable and patient connected to nasal cannula oxygen Cardiovascular status: blood pressure returned to baseline and stable Postop Assessment: no headache, no signs of nausea or vomiting and no backache Anesthetic complications: no    Last Vitals:  Filed Vitals:   01/31/15 0731 01/31/15 1038  BP: 115/65 111/65  Pulse: 60 48  Temp: 36.6 C 36.7 C  Resp: 18 18    Last Pain:  Filed Vitals:   01/31/15 1039  PainSc: 5                  Lenard Simmer

## 2015-01-31 NOTE — Anesthesia Procedure Notes (Signed)
Procedure Name: Intubation Date/Time: 01/31/2015 12:14 PM Performed by: [id number], Rayaan Garguilo Pre-anesthesia Checklist: Patient identified, Timeout performed, Patient being monitored, Suction available and Emergency Drugs available Patient Re-evaluated:Patient Re-evaluated prior to inductionOxygen Delivery Method: Circle system utilized Preoxygenation: Pre-oxygenation with 100% oxygen Intubation Type: IV induction Ventilation: Mask ventilation without difficulty Laryngoscope Size: Mac and 3 Grade View: Grade II Tube type: Oral Tube size: 6.5 mm Number of attempts: 1 Placement Confirmation: breath sounds checked- equal and bilateral,  CO2 detector,  positive ETCO2 and ETT inserted through vocal cords under direct vision Secured at: 21 cm Tube secured with: Tape

## 2015-01-31 NOTE — Progress Notes (Addendum)
Daily Post Partum Note  Rachel Baird is a 35 y.o. [id number] PPD#1 s/p  SVD/intact perineum  @ [redacted]w[redacted]d.  Pregnancy c/b gestational thrombocytopenia, h/o GC, BMI 27, hypothyroidism, anxiety/depression, tobacco and THC use, h/o MRSA, h/o abnormal paps, HA  24hr/overnight events:  none  Subjective:  No problems or issues. Pt has NPO p MN.  Objective:    Current Vital Signs 24h Vital Sign Ranges  T 97.8 F (36.6 C) Temp  Avg: 98.3 F (36.8 C)  Min: 97.6 F (36.4 C)  Max: 98.8 F (37.1 C)  BP 115/65 mmHg BP  Min: 97/62  Max: 126/63  HR 60 Pulse  Avg: 62.3  Min: 47  Max: 82  RR 18 Resp  Avg: 18.3  Min: 18  Max: 20  SaO2 98 % Not Delivered SpO2  Avg: 98.3 %  Min: 97 %  Max: 99 %       24 Hour I/O Current Shift I/O  Time Ins Outs 01/20 0701 - 01/21 0700 In: -  Out: 200 [Urine:200]      General: NAD Abdomen: FF below the umbilicus approx 2cm, nttp, nd Perineum: deferred Skin:  Warm and dry.  Cardiovascular: Regular rate and rhythm. Respiratory:  Clear to auscultation bilateral. Normal respiratory effort Extremities: no c/c/e  Medications Current Facility-Administered Medications  Medication Dose Route Frequency Provider Last Rate Last Dose  . acetaminophen (TYLENOL) tablet 1,000 mg  1,000 mg Oral Q6H PRN Elenora Fender Ward, MD   1,000 mg at 01/31/15 0558  . acetaminophen (TYLENOL) tablet 650 mg  650 mg Oral Q4H PRN Chelsea C Ward, MD      . citric acid-sodium citrate (ORACIT) solution 30 mL  30 mL Oral Q2H PRN Chelsea C Ward, MD      . witch hazel-glycerin (TUCKS) pad 1 application  1 application Topical PRN Chelsea C Ward, MD       And  . dibucaine (NUPERCAINAL) 1 % rectal ointment 1 application  1 application Rectal PRN Chelsea C Ward, MD      . diphenhydrAMINE (BENADRYL) capsule 25 mg  25 mg Oral Q6H PRN Chelsea C Ward, MD      . diphenhydrAMINE (BENADRYL) injection 12.5 mg  12.5 mg Intravenous Q15 min PRN Yevette Edwards, MD      . docusate sodium (COLACE) capsule 100 mg  100 mg  Oral BID Elenora Fender Ward, MD   100 mg at 01/31/15 0015  . ePHEDrine injection 10 mg  10 mg Intravenous PRN Yevette Edwards, MD      . fentaNYL 2.5 mcg/ml w/ropivacaine 0.2% (preservative free) in normal saline 100 mL EPIDURAL Infusion in 150 ml Intravia Bag  14 mL/hr Epidural Continuous Yevette Edwards, MD      . lactated ringers infusion 500-1,000 mL  500-1,000 mL Intravenous PRN Elenora Fender Ward, MD      . lactated ringers infusion   Intravenous Continuous Elenora Fender Ward, MD   Stopped at 01/30/15 2015  . lanolin ointment   Topical PRN Chelsea C Ward, MD      . lidocaine (PF) (XYLOCAINE) 1 % injection 30 mL  30 mL Subcutaneous PRN Chelsea C Ward, MD      . misoprostol (CYTOTEC) tablet 25 mcg  25 mcg Vaginal Q4H PRN Tresea Mall, CNM      . ondansetron (ZOFRAN) injection 4 mg  4 mg Intravenous Q6H PRN Chelsea C Ward, MD      . ondansetron [facility]) tablet 4 mg  4 mg Oral  Q4H PRN Elenora Fender Ward, MD       Or  . ondansetron (ZOFRAN) injection 4 mg  4 mg Intravenous Q4H PRN Chelsea C Ward, MD      . oxyCODONE-acetaminophen (PERCOCET/ROXICET) 5-325 MG per tablet 1 tablet  1 tablet Oral Q4H PRN Chelsea C Ward, MD      . oxytocin (PITOCIN) IV BOLUS FROM BAG  500 mL Intravenous Continuous Chelsea C Ward, MD      . oxytocin (PITOCIN) IV infusion 40 units in LR 1000 mL - Premix  2.5 Units/hr Intravenous Continuous Elenora Fender Ward, MD 62.5 mL/hr at 01/30/15 2110 2.5 Units/hr at 01/30/15 2110  . oxytocin (PITOCIN) IV infusion 40 units in LR 1000 mL - Premix  1-40 milli-units/min Intravenous Continuous Chelsea C Ward, MD      . oxytocin (PITOCIN) IV infusion 40 units in LR 1000 mL - Premix  1-40 milli-units/min Intravenous Titrated Chelsea C Ward, MD 4.5 mL/hr at 01/30/15 2002 3 milli-units/min at 01/30/15 2002  . PHENYLephrine 40 mcg/ml in normal saline Adult IV Push Syringe  80 mcg Intravenous PRN Yevette Edwards, MD      . prenatal multivitamin tablet 1 tablet  1 tablet Oral Q1200 Chelsea C Ward, MD      .  simethicone [facility]) chewable tablet 80 mg  80 mg Oral PRN Chelsea C Ward, MD      . Tdap (BOOSTRIX) injection 0.5 mL  0.5 mL Intramuscular Once Chelsea C Ward, MD      . terbutaline (BRETHINE) injection 0.25 mg  0.25 mg Subcutaneous Once PRN Tresea Mall, CNM      . terbutaline (BRETHINE) injection 0.25 mg  0.25 mg Subcutaneous Once PRN Elenora Fender Ward, MD       Facility-Administered Medications Ordered in Other Encounters  Medication Dose Route Frequency Provider Last Rate Last Dose  . bupivacaine (PF) (MARCAINE) 0.25 % injection    Anesthesia Intra-op Mathews Argyle, CRNA   5 mL at 01/30/15 1603  . lidocaine-EPINEPHrine 1.5 %-1:200000 injection    Anesthesia Intra-op Mathews Argyle, CRNA   3 mL at 01/30/15 1557     Recent Labs Lab 01/30/15 1237 01/31/15 0628  WBC 14.0* 13.2*  HGB 12.3 11.1*  HCT 37.9 34.3*  PLT 115* 101*   UDS: +THC  Assessment & Plan:  Pt doing well *Postpartum/postop: routine care *Heme: plts acceptable for surgery today *BTL: r/b/a d/w pt including surgical risks and risk of regret and pt wants to proceed. PW is in order. tentative time of 1130am *Social: SW has been consulted *Endocrine: pt on no meds for low thyroid with nl TFTs during pregnancy *Psych: mood is good; continue home zoloft *ID: admit mrsa swab negative *Dispo: likely tomorrow  [employer] Pager [phone]

## 2015-01-31 NOTE — Anesthesia Postprocedure Evaluation (Signed)
Anesthesia Post Note  Patient: Rachel Baird  Procedure(s) Performed: Procedure(s) (LRB): BILATERAL TUBAL LIGATION (Bilateral)  Patient location during evaluation: PACU Anesthesia Type: General Level of consciousness: awake and alert Pain management: pain level controlled Vital Signs Assessment: post-procedure vital signs reviewed and stable Respiratory status: spontaneous breathing, nonlabored ventilation, respiratory function stable and patient connected to nasal cannula oxygen Cardiovascular status: blood pressure returned to baseline and stable Postop Assessment: no signs of nausea or vomiting Anesthetic complications: no    Last Vitals:  Filed Vitals:   01/31/15 1626 01/31/15 1629  BP: 155/84 155/84  Pulse: 49 49  Temp: 36.8 C 36.8 C  Resp:  18    Last Pain:  Filed Vitals:   01/31/15 1819  PainSc: 7                  Lenard Simmer

## 2015-01-31 NOTE — Anesthesia Preprocedure Evaluation (Signed)
Anesthesia Evaluation  Patient identified by MRN, date of birth, ID band Patient awake    Reviewed: Allergy & Precautions, H&P , NPO status , Patient's Chart, lab work & pertinent test results, reviewed documented beta blocker date and time   History of Anesthesia Complications Negative for: history of anesthetic complications  Airway Mallampati: III  TM Distance: <3 FB Neck ROM: full    Dental no notable dental hx. (+) Missing, Poor Dentition   Pulmonary neg shortness of breath, neg sleep apnea, neg COPD, neg recent URI, Current Smoker,    Pulmonary exam normal breath sounds clear to auscultation       Cardiovascular Exercise Tolerance: Good negative cardio ROS Normal cardiovascular exam Rhythm:regular Rate:Normal     Neuro/Psych PSYCHIATRIC DISORDERS (Depression) negative neurological ROS  negative psych ROS   GI/Hepatic negative GI ROS, (+)     substance abuse  marijuana use,   Endo/Other  neg diabetesHypothyroidism (no longer on medication)   Renal/GU negative Renal ROS  negative genitourinary   Musculoskeletal   Abdominal   Peds  Hematology negative hematology ROS (+)   Anesthesia Other Findings Past Medical History:   Vaginal Pap smear, abnormal                                  Headache                                                     Hypothyroidism                                                 Comment:d/ced synthroid age 55; had normal TFTs 08/2014   Depression                                                     Comment:self d/ced zoloft age 80   Reproductive/Obstetrics negative OB ROS                             Anesthesia Physical Anesthesia Plan  ASA: II  Anesthesia Plan: General   Post-op Pain Management:    Induction:   Airway Management Planned:   Additional Equipment:   Intra-op Plan:   Post-operative Plan:   Informed Consent: I have reviewed the  patients History and Physical, chart, labs and discussed the procedure including the risks, benefits and alternatives for the proposed anesthesia with the patient or authorized representative who has indicated his/her understanding and acceptance.   Dental Advisory Given  Plan Discussed with: Anesthesiologist, CRNA and Surgeon  Anesthesia Plan Comments:         Anesthesia Quick Evaluation

## 2015-01-31 NOTE — Progress Notes (Signed)
Gave report to OR Nurse Venetia Night RN.  Will send for pt shortly.

## 2015-02-01 ENCOUNTER — Encounter: Payer: Self-pay | Admitting: Internal Medicine

## 2015-02-01 DIAGNOSIS — R001 Bradycardia, unspecified: Secondary | ICD-10-CM | POA: Diagnosis not present

## 2015-02-01 DIAGNOSIS — I10 Essential (primary) hypertension: Secondary | ICD-10-CM

## 2015-02-01 LAB — CBC
HCT: 34.4 % — ABNORMAL LOW (ref 35.0–47.0)
Hemoglobin: 11.4 g/dL — ABNORMAL LOW (ref 12.0–16.0)
MCH: 29.3 pg (ref 26.0–34.0)
MCHC: 33.1 g/dL (ref 32.0–36.0)
MCV: 88.6 fL (ref 80.0–100.0)
PLATELETS: 112 10*3/uL — AB (ref 150–440)
RBC: 3.88 MIL/uL (ref 3.80–5.20)
RDW: 13.9 % (ref 11.5–14.5)
WBC: 13.7 10*3/uL — ABNORMAL HIGH (ref 3.6–11.0)

## 2015-02-01 LAB — PROTEIN / CREATININE RATIO, URINE: CREATININE, URINE: 54 mg/dL

## 2015-02-01 LAB — COMPREHENSIVE METABOLIC PANEL
ALT: 13 U/L — ABNORMAL LOW (ref 14–54)
ANION GAP: 6 (ref 5–15)
AST: 17 U/L (ref 15–41)
Albumin: 2.3 g/dL — ABNORMAL LOW (ref 3.5–5.0)
Alkaline Phosphatase: 112 U/L (ref 38–126)
BUN: 6 mg/dL (ref 6–20)
CHLORIDE: 108 mmol/L (ref 101–111)
CO2: 24 mmol/L (ref 22–32)
Calcium: 8.4 mg/dL — ABNORMAL LOW (ref 8.9–10.3)
Creatinine, Ser: 0.59 mg/dL (ref 0.44–1.00)
GFR calc non Af Amer: >60 mL/min (ref >60)
Glucose, Bld: 107 mg/dL — ABNORMAL HIGH (ref 65–99)
Potassium: 3.9 mmol/L (ref 3.5–5.1)
SODIUM: 138 mmol/L (ref 135–145)
Total Bilirubin: 0.3 mg/dL (ref 0.3–1.2)
Total Protein: 5.5 g/dL — ABNORMAL LOW (ref 6.5–8.1)

## 2015-02-01 LAB — TSH: TSH: 6.972 u[IU]/mL — AB (ref 0.350–4.500)

## 2015-02-01 LAB — T4, FREE: FREE T4: 0.78 ng/dL (ref 0.61–1.12)

## 2015-02-01 MED ORDER — HYDRALAZINE HCL 25 MG PO TABS
25.0000 mg | ORAL_TABLET | Freq: Two times a day (BID) | ORAL | Status: DC
  Administered 2015-02-02: 25 mg via ORAL
  Filled 2015-02-01 (×3): qty 1

## 2015-02-01 MED ORDER — METHYLDOPA 250 MG PO TABS
250.0000 mg | ORAL_TABLET | ORAL | Status: AC
  Administered 2015-02-01: 250 mg via ORAL
  Filled 2015-02-01 (×2): qty 1

## 2015-02-01 MED ORDER — HYDRALAZINE HCL 10 MG PO TABS
10.0000 mg | ORAL_TABLET | Freq: Once | ORAL | Status: AC
Start: 2015-02-01 — End: 2015-02-01
  Administered 2015-02-01: 10 mg via ORAL
  Filled 2015-02-01: qty 1

## 2015-02-01 MED ORDER — HYDRALAZINE HCL 10 MG PO TABS
10.0000 mg | ORAL_TABLET | Freq: Three times a day (TID) | ORAL | Status: DC
  Administered 2015-02-01 (×2): 10 mg via ORAL
  Filled 2015-02-01 (×4): qty 1

## 2015-02-01 MED ORDER — METHYLDOPA 250 MG PO TABS
250.0000 mg | ORAL_TABLET | Freq: Three times a day (TID) | ORAL | Status: DC
  Filled 2015-02-01 (×3): qty 1

## 2015-02-01 NOTE — Clinical Social Work Maternal (Signed)
CLINICAL SOCIAL WORK MATERNAL/CHILD NOTE  Patient Details  Name: Rachel Baird MRN: [id number] Date of Birth: 28-Mar-1980  Date:  02/01/2015  Clinical Social Worker Initiating Note:   Blima Rich, [place] [phone]) Date/ Time Initiated:  02/01/15/1627     Child's Name:    Rachel Baird   Legal Guardian:  Father & Mother  Need for Interpreter:  None   Date of Referral:  02/01/15     Reason for Referral:  Current Substance Use/Substance Use During Pregnancy    Referral Source:  RN   Address:   ([address] [place], [place] )  Phone number:  [phone]   Household Members:  Minor Children, Relatives, Significant Other   Natural Supports (not living in the home):  Extended Family, Friends   Professional Supports: Case Metall[employer]ist [employer] copy)   Employment: Unemployed   Type of Work:     Education:  Database administrator Resources:  [employer]   Other Resources:  [employer], Physicist, medical    Cultural/Religious Considerations Which May Impact Care: N/A  Strengths:  Other (Comment) (Strong Family Support. )   Risk Factors/Current Problems:  Transportation , Substance Use    Cognitive State:  Linear Thinking , Alert    Mood/Affect:  Calm , Comfortable    CSW Assessment: Clinical Education officer, museum (CSW) received call from RN stating that infant has a positive urine screen for marijuana. Per RN mother will discharge tomorrow and baby is discharging today. Infant is staying with mother and father tonight in hospital room. CSW met with patient and infant Rachel Baird and father of the baby Elige Ko was at bedside. CSW introduced self and explained role of CSW department. Mother reported that she lives in [place] with [name], his parents Santiago Glad and Court Joy, 3 y.o [place] and other minor child. Mother and [name] have custody of 33 y.o [place]. Grandmother Santiago Glad has custody of other minor child and Karen's daughter is the biological mother. Per mother she is unemployed  right now and [name] works at [employer]. Per mother she has 2 other children that live in [place] with their father. Per mother she has lived in [place], [place] since 2015. Mother reported that she has [place], Medicaid, and Medicaid transportation. Per [name] he has not left the hospital since mother gave birth. CSW asked if mother was comfortable with [name] staying in the room before asking about marijuana use. Per mother [name] is aware of positive drug screen and can stay in the room. CSW asked about marijuana use during pregnancy and made mother aware of infant's positive drug screen for marijuana. Per mother she used marijuana because she was not taking her Zoloft and marijuana helped with her depression. Per patient she is now taking Zoloft again and has stopped the marijuana use. CSW made mother aware that a Child Protective Services report will have to be made. Mother and father reacted approprietly. CSW provided mother with community recourses such as [facility] and Development worker, community. Per mother and father they will need assistance with transportation tomorrow. CSW Corporate treasurer. [name] reported that he does not get paid until Friday and when they discharge tomorrow they will not have diapers. CSW also called Claiborne Billings with [employer] and left a voicemail to see if any donations can be made.   CSW made Child Protective Services report in [place]. RN aware of above.    CSW Plan/Description:  Child Protective Service Report     Loralyn [place],  LCSW 02/01/2015, 4:30 PM

## 2015-02-01 NOTE — Progress Notes (Signed)
  Postpartum Day 2 , post BTL day #1 Subjective: voiding, tolerating PO and moderate pain, managed fairly well with po meds  Objective: Blood pressure 140/86, pulse 47, temperature 98.4 F (36.9 C), temperature source Oral, resp. rate 16, last menstrual period 04/30/2014, SpO2 100 %, BP: 155-166/78-93 since 1400 yesterday, pulse 40s-50s  Physical Exam:  General: alert and cooperative Lochia: appropriate Uterine Fundus: firm Incision: healing well, no significant drainage DVT Evaluation: No evidence of DVT seen on physical exam. Abdomen: mildly distended   Recent Labs  01/31/15 0628 02/01/15 0427  HGB 11.1* 11.4*  HCT 34.3* 34.4*   Pre-eclampsia labs negative,   Bedside EKG: sinus bradycardia  Assessment PPD #2, s/p BTL, hypertension and bradycardia  Plan: Continue PP care  Hydralazine for HTN. EKG ordered. Continue to monitor in-pt due to recent onset of HTN. Reviewed POM with Dr Vergie Living.   Feeding: bottle Contraception: s/p BTL Blood Type: A+ RI/VI TDAP UTD Flu UTD    Marta Antu, [place] 02/01/2015, 10:45 AM

## 2015-02-01 NOTE — Consult Note (Signed)
History and Physical    Rachel Baird ZOX:[id number] DOB: Sep 10, 1980 DOA: 01/30/2015  Referring physician: Dr. Elesa Massed PCP: Alberteen Spindle, CNM  Specialists: N/A  Chief Complaint: HTN and bradycardia  HPI: Rachel Baird is a 35 y.o. female has a past medical history significant for childhood hypothyroidism now 2d out from BTL with HTN and bradycardia. Patient denies cardiac hx and is asymptomatic. Denies CP or SOB. No palpitations. Denies syncope or near syncope. Started on Hydralazine by GYN with some improvement of BP but remains bradycardic. Labs including TSH non-diagnostic.  Review of Systems: The patient denies anorexia, fever, weight loss,, vision loss, decreased hearing, hoarseness, chest pain, syncope, dyspnea on exertion, peripheral edema, balance deficits, hemoptysis, abdominal pain, melena, hematochezia, severe indigestion/heartburn, hematuria, incontinence, genital sores, muscle weakness, suspicious skin lesions, transient blindness, difficulty walking, depression, unusual weight change, abnormal bleeding, enlarged lymph nodes, angioedema, and breast masses.   Past Medical History  Diagnosis Date  . Vaginal Pap smear, abnormal   . Headache   . Hypothyroidism     d/ced synthroid age 25; had normal TFTs 08/2014  . Depression     self d/ced zoloft age 60   Past Surgical History  Procedure Laterality Date  . Cholecystectomy     Social History:  reports that she has been smoking Cigarettes.  She has a 2.5 pack-year smoking history. She does not have any smokeless tobacco history on file. She reports that she uses illicit drugs (Marijuana). She reports that she does not drink alcohol.  Allergies  Allergen Reactions  . Minocycline Swelling    Palms hand and soles of feet red and burning    Family History  Problem Relation Age of Onset  . COPD Mother   . Heart disease Mother   . Cancer Father   . COPD Father   . Diabetes Paternal Grandmother   . Diabetes Paternal  Grandfather     Prior to Admission medications   Medication Sig Start Date End Date Taking? Authorizing Provider  acetaminophen (TYLENOL) 325 MG tablet Take 650 mg by mouth every 6 (six) hours as needed for headache (pt takes occaisionally).   Yes Historical Provider, MD  Prenatal Vit-Fe Fumarate-FA (PRENATAL MULTIVITAMIN) TABS tablet Take 1 tablet by mouth daily at 12 noon.   Yes Historical Provider, MD  sertraline (ZOLOFT) 25 MG tablet Take 1 tablet (25 mg total) by mouth daily. Patient taking differently: Take 100 mg by mouth daily.  11/03/14  Yes Kirby Funk, MD   Physical Exam: Filed Vitals:   02/01/15 0407 02/01/15 0414 02/01/15 0817 02/01/15 1156  BP: 162/89 154/80 140/86 149/82  Pulse: 44 45 47 46  Temp: 97.9 F (36.6 C)  98.4 F (36.9 C) 98 F (36.7 C)  TempSrc: Oral  Oral Oral  Resp: 16     SpO2: 100%  100% 100%     General:  No apparent distress  Eyes: PERRL, EOMI, no scleral icterus  ENT: moist oropharynx  Neck: supple, no lymphadenopathy  Cardiovascular: slow rate with regular rhythm without MRG; 2+ peripheral pulses, no JVD, no peripheral edema  Respiratory: CTA biL, good air movement without wheezing, rhonchi or crackled  Abdomen: soft, non tender to palpation, positive bowel sounds, no guarding, no rebound  Skin: no rashes  Musculoskeletal: normal bulk and tone, no joint swelling  Psychiatric: normal mood and affect  Neurologic: CN 2-12 grossly intact, Motor strength 5/5 in all 4 groups. Sensory exam normal. DTR's symmetric  Labs on Admission:  Basic Metabolic Panel:  Recent Labs Lab 02/01/15 0427  NA 138  K 3.9  CL 108  CO2 24  GLUCOSE 107*  BUN 6  CREATININE 0.59  CALCIUM 8.4*   Liver Function Tests:  Recent Labs Lab 02/01/15 0427  AST 17  ALT 13*  ALKPHOS 112  BILITOT 0.3  PROT 5.5*  ALBUMIN 2.3*   No results for input(s): LIPASE, AMYLASE in the last 168 hours. No results for input(s): AMMONIA in the last 168  hours. CBC:  Recent Labs Lab 01/30/15 1237 01/31/15 0628 02/01/15 0427  WBC 14.0* 13.2* 13.7*  HGB 12.3 11.1* 11.4*  HCT 37.9 34.3* 34.4*  MCV 86.7 86.9 88.6  PLT 115* 101* 112*   Cardiac Enzymes: No results for input(s): CKTOTAL, CKMB, CKMBINDEX, TROPONINI in the last 168 hours.  BNP (last 3 results) No results for input(s): BNP in the last 8760 hours.  ProBNP (last 3 results) No results for input(s): PROBNP in the last 8760 hours.  CBG: No results for input(s): GLUCAP in the last 168 hours.  Radiological Exams on Admission: No results found.  EKG: Independently reviewed.  Assessment/Plan Active Problems:   Labor and delivery, indication for care   Bradycardia on ECG   Will modify Hydralazine to  po BID and monitor BP. Order echo. Pt is asymptomatic at this time. Case discussed with Cardiology(dr. Juliann Pares) who recommends watchful waiting at this time. Will follow with you. Call if questions arise.  Diet: per GYN Fluids: per GYN DVT Prophylaxis: per GYN  Code Status: FULL  Family Communication: no  Disposition Plan: home  Time spent: 45 min

## 2015-02-02 ENCOUNTER — Encounter: Payer: Self-pay | Admitting: Obstetrics and Gynecology

## 2015-02-02 ENCOUNTER — Inpatient Hospital Stay
Admit: 2015-02-02 | Discharge: 2015-02-02 | Disposition: A | Discharge status: Home / Self Care | Payer: Medicaid Other | Attending: Internal Medicine | Admitting: Internal Medicine

## 2015-02-02 MED ORDER — HYDRALAZINE HCL 10 MG PO TABS: 10.0000 mg | ORAL_TABLET | Freq: Two times a day (BID) | ORAL | Status: AC

## 2015-02-02 MED ORDER — OXYCODONE-ACETAMINOPHEN 5-325 MG PO TABS: 1.0000 | ORAL_TABLET | Freq: Four times a day (QID) | ORAL | Status: AC | PRN

## 2015-02-02 MED ORDER — SERTRALINE HCL 100 MG PO TABS: 100.0000 mg | ORAL_TABLET | Freq: Every day | ORAL | Status: AC

## 2015-02-02 NOTE — Progress Notes (Signed)
Prenatal records indicate that pt received TDaP vaccine on 11/17/14 and Influenza vaccine on 12/16/14. Reynold Bowen, RN 02/02/2015 2:16 PM

## 2015-02-02 NOTE — Progress Notes (Signed)
*  PRELIMINARY RESULTS* Echocardiogram 2D Echocardiogram has been performed.  Georgann Housekeeper Hege 02/02/2015, 10:46 AM

## 2015-02-02 NOTE — Progress Notes (Signed)
Discharge instructions provided.  Pt and sig other verbalize understanding of all instructions and follow-up care.  Prescriptions given.  Pt discharged to home with infant at 1645 on 02/02/15 via wheelchair by nursing student. Reynold Bowen, RN 02/02/2015 9:30 PM

## 2015-02-02 NOTE — Progress Notes (Signed)
Patient ID: Rachel Baird, female   DOB: 1980-09-01, 35 y.o.   MRN: [id number] [facility] PROGRESS NOTE  Jersie Beel WUJ:[id number] DOB: 1980/06/13 DOA: 01/30/2015 PCP: Alberteen Spindle, CNM  HPI/Subjective: Patient had 6-7/10 pain.  Otherwise feels okay.  Objective: Filed Vitals:   02/02/15 1158 02/02/15 1250  BP: 122/66 140/79  Pulse: 51   Temp: 97.9 F (36.6 C)   Resp: 18       ROS: Review of Systems  Constitutional: Negative for fever and chills.  Eyes: Negative for blurred vision.  Respiratory: Negative for cough and shortness of breath.   Cardiovascular: Negative for chest pain.  Gastrointestinal: Positive for abdominal pain. Negative for nausea, vomiting, diarrhea and constipation.  Genitourinary: Negative for dysuria.  Musculoskeletal: Negative for joint pain.  Neurological: Negative for dizziness and headaches.   Exam: Physical Exam  Constitutional: She is oriented to person, place, and time.  HENT:  Nose: No mucosal edema.  Mouth/Throat: No oropharyngeal exudate or posterior oropharyngeal edema.  Eyes: Conjunctivae, EOM and lids are normal. Pupils are equal, round, and reactive to light.  Neck: No JVD present. Carotid bruit is not present. No edema present. No thyroid mass and no thyromegaly present.  Cardiovascular: S1 normal and S2 normal.  Bradycardia present.  Exam reveals no gallop.   No murmur heard. Pulses:      Dorsalis pedis pulses are 2+ on the right side, and 2+ on the left side.  Respiratory: No respiratory distress. She has no wheezes. She has no rhonchi. She has no rales.  GI: Soft. Bowel sounds are normal. There is no tenderness.  Musculoskeletal:       Right ankle: She exhibits no swelling.       Left ankle: She exhibits no swelling.  Lymphadenopathy:    She has no cervical adenopathy.  Neurological: She is alert and oriented to person, place, and time. No cranial nerve deficit.  Skin: Skin is warm. No rash noted. Nails  show no clubbing.  Psychiatric: She has a normal mood and affect.      Data Reviewed: Basic Metabolic Panel:  Recent Labs Lab 02/01/15 0427  NA 138  K 3.9  CL 108  CO2 24  GLUCOSE 107*  BUN 6  CREATININE 0.59  CALCIUM 8.4*   Liver Function Tests:  Recent Labs Lab 02/01/15 0427  AST 17  ALT 13*  ALKPHOS 112  BILITOT 0.3  PROT 5.5*  ALBUMIN 2.3*   CBC:  Recent Labs Lab 01/30/15 1237 01/31/15 0628 02/01/15 0427  WBC 14.0* 13.2* 13.7*  HGB 12.3 11.1* 11.4*  HCT 37.9 34.3* 34.4*  MCV 86.7 86.9 88.6  PLT 115* 101* 112*     Recent Results (from the past 240 hour(s))  MRSA PCR Screening     Status: None   Collection Time: 01/30/15  2:42 PM  Result Value Ref Range Status   MRSA by PCR NEGATIVE NEGATIVE Final    Comment:        The GeneXpert MRSA Assay (FDA approved for NASAL specimens only), is one component of a comprehensive MRSA colonization surveillance program. It is not intended to diagnose MRSA infection nor to guide or monitor treatment for MRSA infections.      Scheduled Meds: . docusate sodium  100 mg Oral BID  . hydrALAZINE  25 mg Oral BID  . lidocaine  1 patch Transdermal Q24H  . prenatal multivitamin  1 tablet Oral Q1200  . sertraline  100 mg Oral Daily  . Tdap  0.5 mL Intramuscular Once    Assessment/Plan:  1. Essential hypertension. Blood pressures have trended better since starting by mouth hydralazine. I spoke with the nurse practitioner and the dose can be decreased to 10 mg twice a day upon discharge home. I spoke with the patient about the side effect profile. Patient states that she is not going to be breast-feeding and the baby is to be bottle fed. The patient feels that with the Lidoderm patch that she became bradycardic and hypertensive at that time. Recheck blood pressure as outpatient and if blood pressures lobe may be able to get off the hydralazine altogether. 2. Bradycardia asymptomatic. No further workup  Code  Status:     Code Status Orders        Start     Ordered   01/30/15 2251  Full code   Continuous     01/30/15 2250    Code Status History    Date Active Date Inactive Code Status Order ID Comments User Context   01/30/2015 12:33 PM 01/30/2015 10:50 PM Full Code [id number]  Elenora Fender Ward, MD Inpatient     Disposition Plan: Patient being discharged today  Time spent: 20 minutes  Alford Highland  [facility]

## 2015-02-02 NOTE — Clinical Social Work Note (Signed)
Patient to discharge today and [facility] unable to schedule for pick up and transport. CSW has approved for taxi voucher to [facility] [address] [employer].  York Spaniel MSW,LCSW [phone]

## 2015-02-02 NOTE — Discharge Instructions (Signed)
Call your doctor for increased pain or vaginal bleeding, temperature above 100.4, depression, or concerns.  No strenuous activity or heavy lifting for 6 weeks.  No intercourse, tampons, douching, or enemas for 6 weeks.  No tub baths-showers only.  No driving for 2 weeks or while taking pain medications.  Continue prenatal vitamin and iron.   °

## 2015-02-03 LAB — SURGICAL PATHOLOGY

## 2015-08-04 ENCOUNTER — Emergency Department: Payer: Medicaid Other

## 2015-08-04 ENCOUNTER — Encounter: Payer: Self-pay | Admitting: Emergency Medicine

## 2015-08-04 ENCOUNTER — Emergency Department
Admission: EM | Admit: 2015-08-04 | Discharge: 2015-08-04 | Disposition: A | Discharge status: Home / Self Care | Payer: Medicaid Other | Attending: Emergency Medicine | Admitting: Emergency Medicine

## 2015-08-04 DIAGNOSIS — Y929 Unspecified place or not applicable: Secondary | ICD-10-CM | POA: Insufficient documentation

## 2015-08-04 DIAGNOSIS — S3992XA Unspecified injury of lower back, initial encounter: Secondary | ICD-10-CM | POA: Diagnosis present

## 2015-08-04 DIAGNOSIS — F17210 Nicotine dependence, cigarettes, uncomplicated: Secondary | ICD-10-CM | POA: Insufficient documentation

## 2015-08-04 DIAGNOSIS — Y999 Unspecified external cause status: Secondary | ICD-10-CM | POA: Diagnosis not present

## 2015-08-04 DIAGNOSIS — F1290 Cannabis use, unspecified, uncomplicated: Secondary | ICD-10-CM | POA: Diagnosis not present

## 2015-08-04 DIAGNOSIS — S39012A Strain of muscle, fascia and tendon of lower back, initial encounter: Secondary | ICD-10-CM | POA: Insufficient documentation

## 2015-08-04 DIAGNOSIS — F329 Major depressive disorder, single episode, unspecified: Secondary | ICD-10-CM | POA: Diagnosis not present

## 2015-08-04 DIAGNOSIS — E039 Hypothyroidism, unspecified: Secondary | ICD-10-CM | POA: Insufficient documentation

## 2015-08-04 DIAGNOSIS — Z79899 Other long term (current) drug therapy: Secondary | ICD-10-CM | POA: Diagnosis not present

## 2015-08-04 DIAGNOSIS — Y9389 Activity, other specified: Secondary | ICD-10-CM | POA: Insufficient documentation

## 2015-08-04 DIAGNOSIS — X500XXA Overexertion from strenuous movement or load, initial encounter: Secondary | ICD-10-CM | POA: Diagnosis not present

## 2015-08-04 MED ORDER — IBUPROFEN 600 MG PO TABS
600.0000 mg | ORAL_TABLET | Freq: Once | ORAL | Status: AC
  Administered 2015-08-04: 600 mg via ORAL
  Filled 2015-08-04: qty 1

## 2015-08-04 MED ORDER — IBUPROFEN 600 MG PO TABS: 600.0000 mg | ORAL_TABLET | Freq: Three times a day (TID) | ORAL | 0 refills | Status: AC | PRN

## 2015-08-04 MED ORDER — TRAMADOL HCL 50 MG PO TABS: 50.0000 mg | ORAL_TABLET | Freq: Four times a day (QID) | ORAL | 0 refills | Status: AC | PRN

## 2015-08-04 MED ORDER — METHOCARBAMOL 500 MG PO TABS
1000.0000 mg | ORAL_TABLET | Freq: Once | ORAL | Status: AC
  Administered 2015-08-04: 1000 mg via ORAL
  Filled 2015-08-04: qty 2

## 2015-08-04 MED ORDER — METHOCARBAMOL 750 MG PO TABS: 750.0000 mg | ORAL_TABLET | Freq: Four times a day (QID) | ORAL | 0 refills | Status: AC

## 2015-08-04 MED ORDER — OXYCODONE-ACETAMINOPHEN 5-325 MG PO TABS
1.0000 | ORAL_TABLET | Freq: Once | ORAL | Status: AC
  Administered 2015-08-04: 1 via ORAL
  Filled 2015-08-04: qty 1

## 2015-08-04 NOTE — ED Provider Notes (Signed)
[facility] Emergency Department Provider Note   ____________________________________________  Time seen: Approximately 7:29 PM  I have reviewed the triage vital signs and the nursing notes.   HISTORY  Chief Complaint Back Pain    HPI Rachel Baird is a 35 y.o. female patient complaining of acute low back pain which started yesterday. Patient states she does repetitive flexion of her back secondary to lifting her daughter was an infant. Patient said her pain wax and wane but increased today with while shopping. Patient has a history of chronic back pain which was never evaluated. Patient say onset at age 64 which she jumped off a porch and landed on her buttocks. No medical evaluation was done at that time. Patient denies any radicular component to this pain she denies any bladder or bowel dysfunction. Patient rates her pain as a 7/10. Patient described a pain as "sharp". No palliative measures taken for this complaint.   Past Medical History:  Diagnosis Date  . Depression    self d/ced zoloft age 84  . Headache   . Hypothyroidism    d/ced synthroid age 33; had normal TFTs 08/2014  . Vaginal Pap smear, abnormal     Patient Active Problem List   Diagnosis Date Noted  . Postpartum care following vaginal delivery 02/02/2015  . Bradycardia on ECG 02/01/2015  . Labor and delivery, indication for care 01/30/2015  . Thrombocytopenia (HCC) 01/01/2015  . Abnormal cervical Pap smear with positive HPV DNA test 11/03/2014    Past Surgical History:  Procedure Laterality Date  . CHOLECYSTECTOMY    . MINI-LAPAROTOMY W/ TUBAL LIGATION  01/30/2014  . TUBAL LIGATION Bilateral 01/31/2015   Procedure: BILATERAL TUBAL LIGATION;  Surgeon: Ellerbe Bing, MD;  Location: [facility] ORS;  Service: Gynecology;  Laterality: Bilateral;    Current Outpatient Rx  . Order #: [id number] Class: Print  . Order #: [id number] Class: Print  . Order #: [id number] Class: Print  . Order #:  [id number] Class: Print  . Order #: [id number] Class: Historical Med  . Order #: [id number] Class: Print  . Order #: [id number] Class: Print    Allergies Minocycline  Family History  Problem Relation Age of Onset  . COPD Mother   . Heart disease Mother   . Cancer Father   . COPD Father   . Diabetes Paternal Grandmother   . Diabetes Paternal Grandfather     Social History Social History  Substance Use Topics  . Smoking status: Current Every Day Smoker    Packs/day: 0.25    Years: 10.00    Types: Cigarettes  . Smokeless tobacco: Not on file  . Alcohol use No    Review of Systems Constitutional: No fever/chills Eyes: No visual changes. ENT: No sore throat. Cardiovascular: Denies chest pain. Respiratory: Denies shortness of breath. Gastrointestinal: No abdominal pain.  No nausea, no vomiting.  No diarrhea.  No constipation. Genitourinary: Negative for dysuria. Musculoskeletal: Positive for back pain. Skin: Negative for rash. Neurological: Negative for headaches, focal weakness or numbness.    ____________________________________________   PHYSICAL EXAM:  VITAL SIGNS: ED Triage Vitals  Enc Vitals Group     BP 08/04/15 1912 114/81     Pulse Rate 08/04/15 1912 70     Resp 08/04/15 1912 18     Temp 08/04/15 1912 98.3 F (36.8 C)     Temp Source 08/04/15 1912 Oral     SpO2 08/04/15 1912 100 %     Weight 08/04/15 1912 162 lb (73.5 kg)     Height  08/04/15 1912  (1.676 m)     Head Circumference --      Peak Flow --      Pain Score 08/04/15 1913 6     Pain Loc --      Pain Edu? --      Excl. in GC? --     Constitutional: Alert and oriented. Well appearing and in no acute distress. Eyes: Conjunctivae are normal. PERRL. EOMI. Head: Atraumatic. Nose: No congestion/rhinnorhea. Mouth/Throat: Mucous membranes are moist.  Oropharynx non-erythematous. Neck: No stridor.  No cervical spine tenderness to palpation. Hematological/Lymphatic/Immunilogical: No cervical  lymphadenopathy. Cardiovascular: Normal rate, regular rhythm. Grossly normal heart sounds.  Good peripheral circulation. Respiratory: Normal respiratory effort.  No retractions. Lungs CTAB. Gastrointestinal: Soft and nontender. No distention. No abdominal bruits. No CVA tenderness. Musculoskeletal: No obvious spinal deformities. Patient has some moderate guarding palpation of the spinal processes of L2-L5. Patient has decreased range of motion with flexion. Patient has left paraspinal muscle spasm. Patient has a negative straight leg test. Neurologic:  Normal speech and language. No gross focal neurologic deficits are appreciated. No gait instability. Skin:  Skin is warm, dry and intact. No rash noted. Psychiatric: Mood and affect are normal. Speech and behavior are normal.  ____________________________________________   LABS (all labs ordered are listed, but only abnormal results are displayed)  Labs Reviewed  POC URINE PREG, ED   ____________________________________________  EKG   ____________________________________________  RADIOLOGY  No acute findings on lumbar x-ray. ____________________________________________   PROCEDURES  Procedure(s) performed: None  Procedures  Critical Care performed: No  ____________________________________________   INITIAL IMPRESSION / ASSESSMENT AND PLAN / ED COURSE  Pertinent labs & imaging results that were available during my care of the patient were reviewed by me and considered in my medical decision making (see chart for details).  Acute lumbar strain. Discussed neck x-ray findings of the L-spine with patient. Patient given discharge Instructions. Patient given a prescription for tramadol, Robaxin, and ibuprofen. Patient advised follow-up family doctor if condition persists.  Clinical Course     ____________________________________________   FINAL CLINICAL IMPRESSION(S) / ED DIAGNOSES  Final diagnoses:  Low back strain,  initial encounter      NEW MEDICATIONS STARTED DURING THIS VISIT:  New Prescriptions   IBUPROFEN (ADVIL,MOTRIN) 600 MG TABLET    Take 1 tablet (600 mg total) by mouth every 8 (eight) hours as needed.   METHOCARBAMOL (ROBAXIN-750) 750 MG TABLET    Take 1 tablet (750 mg total) by mouth 4 (four) times daily.   TRAMADOL (ULTRAM) 50 MG TABLET    Take 1 tablet (50 mg total) by mouth every 6 (six) hours as needed.     Note:  This document was prepared using Dragon voice recognition software and may include unintentional dictation errors.    Joni Reining, PA-C 08/04/15 2139    Myrna Blazer, MD 08/04/15 [phone]

## 2015-08-04 NOTE — ED Triage Notes (Signed)
Pt was picking up daughter yest and felt mid lower back pain hx of the same worse when bending or walking.

## 2015-10-21 ENCOUNTER — Emergency Department
Admission: EM | Admit: 2015-10-21 | Discharge: 2015-10-21 | Disposition: A | Discharge status: Home / Self Care | Payer: Medicaid Other | Attending: Emergency Medicine | Admitting: Emergency Medicine

## 2015-10-21 ENCOUNTER — Encounter: Payer: Self-pay | Admitting: Emergency Medicine

## 2015-10-21 ENCOUNTER — Emergency Department: Payer: Medicaid Other

## 2015-10-21 DIAGNOSIS — Z79899 Other long term (current) drug therapy: Secondary | ICD-10-CM | POA: Diagnosis not present

## 2015-10-21 DIAGNOSIS — Y929 Unspecified place or not applicable: Secondary | ICD-10-CM | POA: Insufficient documentation

## 2015-10-21 DIAGNOSIS — Y9389 Activity, other specified: Secondary | ICD-10-CM | POA: Insufficient documentation

## 2015-10-21 DIAGNOSIS — S93422A Sprain of deltoid ligament of left ankle, initial encounter: Secondary | ICD-10-CM | POA: Diagnosis not present

## 2015-10-21 DIAGNOSIS — F17210 Nicotine dependence, cigarettes, uncomplicated: Secondary | ICD-10-CM | POA: Diagnosis not present

## 2015-10-21 DIAGNOSIS — Z791 Long term (current) use of non-steroidal anti-inflammatories (NSAID): Secondary | ICD-10-CM | POA: Insufficient documentation

## 2015-10-21 DIAGNOSIS — S99912A Unspecified injury of left ankle, initial encounter: Secondary | ICD-10-CM | POA: Diagnosis present

## 2015-10-21 DIAGNOSIS — W108XXA Fall (on) (from) other stairs and steps, initial encounter: Secondary | ICD-10-CM | POA: Insufficient documentation

## 2015-10-21 DIAGNOSIS — Y999 Unspecified external cause status: Secondary | ICD-10-CM | POA: Diagnosis not present

## 2015-10-21 DIAGNOSIS — E039 Hypothyroidism, unspecified: Secondary | ICD-10-CM | POA: Insufficient documentation

## 2015-10-21 MED ORDER — TRAMADOL HCL 50 MG PO TABS
ORAL_TABLET | ORAL | Status: DC
Start: 2015-10-21 — End: 2015-10-21
  Filled 2015-10-21: qty 1

## 2015-10-21 MED ORDER — TRAMADOL HCL 50 MG PO TABS: 50.0000 mg | ORAL_TABLET | Freq: Four times a day (QID) | ORAL | 0 refills | Status: AC | PRN

## 2015-10-21 MED ORDER — TRAMADOL HCL 50 MG PO TABS
50.0000 mg | ORAL_TABLET | Freq: Once | ORAL | Status: AC
Start: 2015-10-21 — End: 2015-10-21
  Administered 2015-10-21: 50 mg via ORAL

## 2015-10-21 NOTE — ED Provider Notes (Signed)
[facility] Emergency Department Provider Note   ____________________________________________   First MD Initiated Contact with Patient 10/21/15 0222     (approximate)  I have reviewed the triage vital signs and the nursing notes.   HISTORY  Chief Complaint Ankle Pain    HPI Rachel Baird is a 35 y.o. female who comes into the hospital today with left ankle pain. The patient reports that she fell tonight and hurt her ankle. The patient was going down the steps and missed a step. She reports that her entire body weight fell onto her left ankle. The patient heard something pop and thought it was initially broken but reports that she was able to bear some weight on that ankle. She decided to go home but as the night progressed the pain just seemed to get worse and the swelling also got worse. The patient decided to come in and get checked out. The patient did not put any ice on her ankle and she did not take any medicine at home for pain. The patient has never hurt her ankle like this before. She is able to move it but with some difficulty. The patient rates her pain an 8 out of 10 in intensity.   Past Medical History:  Diagnosis Date  . Depression    self d/ced zoloft age [age]  . Headache   . Hypothyroidism    d/ced synthroid age 35; had normal TFTs 08/2014  . Vaginal Pap smear, abnormal     Patient Active Problem List   Diagnosis Date Noted  . Postpartum care following vaginal delivery 02/02/2015  . Bradycardia on ECG 02/01/2015  . Labor and delivery, indication for care 01/30/2015  . Thrombocytopenia (HCC) 01/01/2015  . Abnormal cervical Pap smear with positive HPV DNA test 11/03/2014    Past Surgical History:  Procedure Laterality Date  . CHOLECYSTECTOMY    . MINI-LAPAROTOMY W/ TUBAL LIGATION  01/30/2014  . TUBAL LIGATION Bilateral 01/31/2015   Procedure: BILATERAL TUBAL LIGATION;  Surgeon: [place] Bingharlie Pickens, MD;  Location: [facility] ORS;  Service:  Gynecology;  Laterality: Bilateral;    Prior to Admission medications   Medication Sig Start Date End Date Taking? Authorizing Provider  hydrALAZINE (APRESOLINE) 10 MG tablet Take 1 tablet (10 mg total) by mouth 2 (two) times daily. 02/02/15   Tresea MallJane Gledhill, CNM  ibuprofen (ADVIL,MOTRIN) 600 MG tablet Take 1 tablet (600 mg total) by mouth every 8 (eight) hours as needed. 08/04/15   Joni Reiningonald K Smith, PA-C  methocarbamol (ROBAXIN-750) 750 MG tablet Take 1 tablet (750 mg total) by mouth 4 (four) times daily. 08/04/15   Joni Reiningonald K Smith, PA-C  oxyCODONE-acetaminophen (PERCOCET/ROXICET) 5-325 MG tablet Take 1-2 tablets by mouth every 6 (six) hours as needed for severe pain. 02/02/15   Tresea MallJane Gledhill, CNM  Prenatal Vit-Fe Fumarate-FA (PRENATAL MULTIVITAMIN) TABS tablet Take 1 tablet by mouth daily at 12 noon.    Historical Provider, MD  sertraline (ZOLOFT) 100 MG tablet Take 1 tablet (100 mg total) by mouth daily. 02/02/15   Tresea MallJane Gledhill, CNM  traMADol (ULTRAM) 50 MG tablet Take 1 tablet (50 mg total) by mouth every 6 (six) hours as needed. 08/04/15 08/03/16  Joni Reiningonald K Smith, PA-C  traMADol (ULTRAM) 50 MG tablet Take 1 tablet (50 mg total) by mouth every 6 (six) hours as needed. 10/21/15   Rachel ApleyAllison P Marketa Midkiff, MD    Allergies Minocycline  Family History  Problem Relation Age of Onset  . COPD Mother   . Heart disease  Mother   . Cancer Father   . COPD Father   . Diabetes Paternal Grandmother   . Diabetes Paternal Grandfather     Social History Social History  Substance Use Topics  . Smoking status: Current Every Day Smoker    Packs/day: 0.25    Years: 10.00    Types: Cigarettes  . Smokeless tobacco: Never Used  . Alcohol use No    Review of Systems Constitutional: No fever/chills Eyes: No visual changes. ENT: No sore throat. Cardiovascular: Denies chest pain. Respiratory: Denies shortness of breath. Gastrointestinal: No abdominal pain.  No nausea, no vomiting.  No diarrhea.  No  constipation. Genitourinary: Negative for dysuria. Musculoskeletal: Left ankle pain Skin: Negative for rash. Neurological: Negative for headaches, focal weakness or numbness.  10-point ROS otherwise negative.  ____________________________________________   PHYSICAL EXAM:  VITAL SIGNS: ED Triage Vitals  Enc Vitals Group     BP 10/21/15 0113 111/76     Pulse Rate 10/21/15 0113 86     Resp 10/21/15 0113 15     Temp 10/21/15 0113 97.3 F (36.3 C)     Temp Source 10/21/15 0113 Oral     SpO2 10/21/15 0113 98 %     Weight 10/21/15 0114 163 lb (73.9 kg)     Height 10/21/15 0114 5\' 6"  (1.676 m)     Head Circumference --      Peak Flow --      Pain Score 10/21/15 0114 8     Pain Loc --      Pain Edu? --      Excl. in GC? --     Constitutional: Alert and oriented. Well appearing and in no acute distress. Eyes: Conjunctivae are normal. PERRL. EOMI. Head: Atraumatic. Nose: No congestion/rhinnorhea. Mouth/Throat: Mucous membranes are moist.  Oropharynx non-erythematous. Cardiovascular: Normal rate, regular rhythm. Grossly normal heart sounds.  Good peripheral circulation. Respiratory: Normal respiratory effort.  No retractions. Lungs CTAB. Gastrointestinal: Soft and nontender. No distention.  Musculoskeletal: Soft tissue swelling to left lateral ankle with some tenderness to palpation, patient with no numbness and able to move toes. Pain with passive range of motion of her ankle. No tenderness to palpation medially.   Neurologic:  Normal speech and language. Skin:  Skin is warm, dry and intact.  Psychiatric: Mood and affect are normal.   ____________________________________________   LABS (all labs ordered are listed, but only abnormal results are displayed)  Labs Reviewed - No data to display ____________________________________________  EKG  none ____________________________________________  RADIOLOGY  Left ankle  x-ray ____________________________________________   PROCEDURES  Procedure(s) performed: None  Procedures  Critical Care performed: No  ____________________________________________   INITIAL IMPRESSION / ASSESSMENT AND PLAN / ED COURSE  Pertinent labs & imaging results that were available during my care of the patient were reviewed by me and considered in my medical decision making (see chart for details).  This is a 35 year old female who comes into the hospital today with some ankle pain after a fall. The patient reports she did miss a step and fell onto her ankle. We did send the patient for an x-ray. I will give the patient a dose of tramadol and place her in an ankle stirrup. She will be reassessed once the results of the x-ray had returned.  Clinical Course  Value Comment By Time  DG Ankle Complete Left Soft tissue edema without acute fracture or subluxation. Rachel Apley, MD 10/11 [phone]    Patient's x-ray shows no acute fracture but  there is always the possibility for soft tissue injury or ligamentous injury. Again the patient will be given an ankle stirrup as well as some crutches and she will follow-up with orthopedic surgery. I discussed this with the patient and she has no further complaints or concerns. I encouraged her to place some ice on her ankle as well. The patient will be discharged to home. ____________________________________________   FINAL CLINICAL IMPRESSION(S) / ED DIAGNOSES  Final diagnoses:  Sprain of deltoid ligament of left ankle, initial encounter      NEW MEDICATIONS STARTED DURING THIS VISIT:  New Prescriptions   TRAMADOL (ULTRAM) 50 MG TABLET    Take 1 tablet (50 mg total) by mouth every 6 (six) hours as needed.     Note:  This document was prepared using Dragon voice recognition software and may include unintentional dictation errors.    Rachel Apley, MD 10/21/15 [phone]

## 2015-10-21 NOTE — ED Triage Notes (Signed)
Pt helped into wheelchair from personal vehicle due to left ankle injury. Pt reports she tripped coming down stairs, fell onto the left ankle with body weight around 2300. Swelling noted on assessment.

## 2016-05-18 ENCOUNTER — Telehealth: Payer: Self-pay | Admitting: Obstetrics & Gynecology

## 2016-05-18 NOTE — Telephone Encounter (Signed)
[facility] for Abnormal pap [facility]. [facility] is an new pt to [facility]. We are on a temparily hold on new medicaid pt's at this time. Notifying Referral [facility] at [facility] unable to see pt at this time. Spoke with Edy.

## 2016-06-12 DIAGNOSIS — F17210 Nicotine dependence, cigarettes, uncomplicated: Secondary | ICD-10-CM | POA: Diagnosis not present

## 2016-06-12 DIAGNOSIS — S91312A Laceration without foreign body, left foot, initial encounter: Secondary | ICD-10-CM | POA: Diagnosis not present

## 2016-06-12 DIAGNOSIS — W208XXA Other cause of strike by thrown, projected or falling object, initial encounter: Secondary | ICD-10-CM | POA: Diagnosis not present

## 2016-06-12 DIAGNOSIS — Y9389 Activity, other specified: Secondary | ICD-10-CM | POA: Insufficient documentation

## 2016-06-12 DIAGNOSIS — S99922A Unspecified injury of left foot, initial encounter: Secondary | ICD-10-CM | POA: Diagnosis present

## 2016-06-12 DIAGNOSIS — E039 Hypothyroidism, unspecified: Secondary | ICD-10-CM | POA: Insufficient documentation

## 2016-06-12 DIAGNOSIS — Y929 Unspecified place or not applicable: Secondary | ICD-10-CM | POA: Insufficient documentation

## 2016-06-12 DIAGNOSIS — S9032XA Contusion of left foot, initial encounter: Secondary | ICD-10-CM | POA: Insufficient documentation

## 2016-06-12 DIAGNOSIS — Y999 Unspecified external cause status: Secondary | ICD-10-CM | POA: Insufficient documentation

## 2016-06-13 ENCOUNTER — Emergency Department: Payer: Medicaid Other

## 2016-06-13 ENCOUNTER — Emergency Department
Admission: EM | Admit: 2016-06-13 | Discharge: 2016-06-13 | Disposition: A | Discharge status: Home / Self Care | Payer: Medicaid Other | Attending: Emergency Medicine | Admitting: Emergency Medicine

## 2016-06-13 DIAGNOSIS — S9032XA Contusion of left foot, initial encounter: Secondary | ICD-10-CM

## 2016-06-13 DIAGNOSIS — S91312A Laceration without foreign body, left foot, initial encounter: Secondary | ICD-10-CM

## 2016-06-13 MED ORDER — TETANUS-DIPHTH-ACELL PERTUSSIS 5-2.5-18.5 LF-MCG/0.5 IM SUSP
0.5000 mL | Freq: Once | INTRAMUSCULAR | Status: AC
  Administered 2016-06-13: 0.5 mL via INTRAMUSCULAR
  Filled 2016-06-13: qty 0.5

## 2016-06-13 MED ORDER — TRAMADOL HCL 50 MG PO TABS: 50.0000 mg | ORAL_TABLET | Freq: Four times a day (QID) | ORAL | 0 refills | Status: AC | PRN

## 2016-06-13 NOTE — ED Triage Notes (Signed)
Pt presents to ED c/o left foot injury r/t large glass tea container falling on her foot;presents with small wound opening.

## 2016-06-13 NOTE — ED Provider Notes (Signed)
[facility] Emergency Department Provider Note   ____________________________________________   First MD Initiated Contact with Patient 06/13/16 [phone]     (approximate)  I have reviewed the triage vital signs and the nursing notes.   HISTORY  Chief Complaint Foot Injury    HPI Rochele RaringRebecca Fuchs is a 36 y.o. female who comes into the hospital today with a laceration to the top of her left foot. The patient reports that she dropped a glass jog on the top of her foot. She has a large laceration to the top for foot as well. The patient states that this occurred at 11 PM. She reports that she still bleeding even through the gauze. She reached her pain a 7-8 out of 10 in intensity. The patient's last tetanus was about 7 years ago. The patient came into the hospital today for evaluation.   Past Medical History:  Diagnosis Date  . Depression    self d/ced zoloft age 36  . Headache   . Hypothyroidism    d/ced synthroid age 36; had normal TFTs 08/2014  . Vaginal Pap smear, abnormal     Patient Active Problem List   Diagnosis Date Noted  . Postpartum care following vaginal delivery 02/02/2015  . Bradycardia on ECG 02/01/2015  . Labor and delivery, indication for care 01/30/2015  . Thrombocytopenia (HCC) 01/01/2015  . Abnormal cervical Pap smear with positive HPV DNA test 11/03/2014    Past Surgical History:  Procedure Laterality Date  . CHOLECYSTECTOMY    . MINI-LAPAROTOMY W/ TUBAL LIGATION  01/30/2014  . TUBAL LIGATION Bilateral 01/31/2015   Procedure: BILATERAL TUBAL LIGATION;  Surgeon: [place] Bingharlie Pickens, MD;  Location: [facility] ORS;  Service: Gynecology;  Laterality: Bilateral;    Prior to Admission medications   Medication Sig Start Date End Date Taking? Authorizing Provider  hydrALAZINE (APRESOLINE) 10 MG tablet Take 1 tablet (10 mg total) by mouth 2 (two) times daily. 02/02/15   Tresea MallGledhill, Jane, CNM  ibuprofen (ADVIL,MOTRIN) 600 MG tablet Take 1 tablet (600  mg total) by mouth every 8 (eight) hours as needed. 08/04/15   Joni ReiningSmith, Ronald K, PA-C  methocarbamol (ROBAXIN-750) 750 MG tablet Take 1 tablet (750 mg total) by mouth 4 (four) times daily. 08/04/15   Joni ReiningSmith, Ronald K, PA-C  oxyCODONE-acetaminophen (PERCOCET/ROXICET) 5-325 MG tablet Take 1-2 tablets by mouth every 6 (six) hours as needed for severe pain. 02/02/15   Tresea MallGledhill, Jane, CNM  Prenatal Vit-Fe Fumarate-FA (PRENATAL MULTIVITAMIN) TABS tablet Take 1 tablet by mouth daily at 12 noon.    [provider]  sertraline (ZOLOFT) 100 MG tablet Take 1 tablet (100 mg total) by mouth daily. 02/02/15   Tresea MallGledhill, Jane, CNM  traMADol (ULTRAM) 50 MG tablet Take 1 tablet (50 mg total) by mouth every 6 (six) hours as needed. 08/04/15 08/03/16  Joni ReiningSmith, Ronald K, PA-C  traMADol (ULTRAM) 50 MG tablet Take 1 tablet (50 mg total) by mouth every 6 (six) hours as needed. 10/21/15   Rebecka ApleyWebster, Sharra Cayabyab P, MD  traMADol (ULTRAM) 50 MG tablet Take 1 tablet (50 mg total) by mouth every 6 (six) hours as needed. 06/13/16   Rebecka ApleyWebster, Vincy Feliz P, MD    Allergies Minocycline  Family History  Problem Relation Age of Onset  . COPD Mother   . Heart disease Mother   . Cancer Father   . COPD Father   . Diabetes Paternal Grandmother   . Diabetes Paternal Grandfather     Social History Social History  Substance Use Topics  .  Smoking status: Current Every Day Smoker    Packs/day: 0.25    Years: 10.00    Types: Cigarettes  . Smokeless tobacco: Never Used  . Alcohol use No    Review of Systems  Constitutional: No fever/chills Eyes: No visual changes. ENT: No sore throat. Cardiovascular: Denies chest pain. Respiratory: Denies shortness of breath. Gastrointestinal: No abdominal pain.  No nausea, no vomiting.  No diarrhea.  No constipation. Genitourinary: Negative for dysuria. Musculoskeletal: Foot pain Skin: Laceration Neurological: Negative for headaches, focal weakness or  numbness.   ____________________________________________   PHYSICAL EXAM:  VITAL SIGNS: ED Triage Vitals [06/13/16 0006]  Enc Vitals Group     BP (!) 117/51     Pulse Rate (!) 55     Resp 18     Temp 98.7 F (37.1 C)     Temp Source Oral     SpO2 98 %     Weight 173 lb (78.5 kg)     Height 5\' 5"  (1.651 m)     Head Circumference      Peak Flow      Pain Score 7     Pain Loc      Pain Edu?      Excl. in [place]?     Constitutional: Alert and oriented. Well appearing and in Mild distress. Eyes: Conjunctivae are normal. PERRL. EOMI. Head: Atraumatic. Nose: No congestion/rhinnorhea. Mouth/Throat: Mucous membranes are moist.  Oropharynx non-erythematous. Cardiovascular: Normal rate, regular rhythm. Grossly normal heart sounds.  Good peripheral circulation. Respiratory: Normal respiratory effort.  No retractions. Lungs CTAB. Gastrointestinal: Soft and nontender. No distention. Positive bowel sounds Musculoskeletal: Tenderness to palpation to the top of the patient's foot  Neurologic:  Normal speech and language.  Skin:  Skin is warm, dry approximately 4.5 cm laceration to the top of the patient's left foot with some soft tissue swelling and bruising. Psychiatric: Mood and affect are normal.   ____________________________________________   LABS (all labs ordered are listed, but only abnormal results are displayed)  Labs Reviewed - No data to display ____________________________________________  EKG  none ____________________________________________  RADIOLOGY  Dg Foot Complete Left  Result Date: 06/13/2016 CLINICAL DATA:  Left foot injury. Large plasty container fell on foot. Small wound. EXAM: LEFT FOOT - COMPLETE 3+ VIEW COMPARISON:  10/21/2015 left ankle radiographs FINDINGS: There is no evidence of fracture or dislocation. There is no evidence of arthropathy or other focal bone abnormality. Soft tissues are unremarkable. No radiopaque foreign body or definite  laceration identified radiographically appear IMPRESSION: Negative for acute fracture, radiopaque foreign body or significant soft tissue swelling. Electronically Signed   By: Tollie Eth M.D.   On: 06/13/2016 02:05    ____________________________________________   PROCEDURES  Procedure(s) performed: please, see procedure note(s).  Marland Kitchen.Laceration Repair Date/Time: 06/13/2016 2:20 AM Performed by: Rebecka Apley Authorized by: Rebecka Apley   Consent:    Consent obtained:  Verbal   Consent given by:  Patient Laceration details:    Location:  Foot   Foot location:  Top of L foot   Length (cm):  4.5 Repair type:    Repair type:  Simple Pre-procedure details:    Preparation:  Patient was prepped and draped in usual sterile fashion Treatment:    Area cleansed with:  Betadine and saline   Amount of cleaning:  Standard   Irrigation method:  Syringe Skin repair:    Repair method:  Sutures   Suture size:  4-0   Suture material:  Nylon   Suture technique:  Simple interrupted   Number of sutures:  4 Approximation:    Approximation:  Close Post-procedure details:    Dressing:  Antibiotic ointment   Patient tolerance of procedure:  Tolerated well, no immediate complications    Critical Care performed: No  ____________________________________________   INITIAL IMPRESSION / ASSESSMENT AND PLAN / ED COURSE  Pertinent labs & imaging results that were available during my care of the patient were reviewed by me and considered in my medical decision making (see chart for details).  This is a 36 year old female who comes into the hospital today with a laceration to the top of her foot after dropping a bottle on her foot. The patient had neck rate which did not show any fracture or radiopaque foreign body. I did repair the patient's wound. She will be discharged home to follow-up with her primary care physician. She should have her sutures removed in approximately 7-10  days.  Clinical Course as of Jun 14 343  Mon Jun 13, 2016  0215 Negative for acute fracture, radiopaque foreign body or significant soft tissue swelling.   DG Foot Complete Left [AW]    Clinical Course User Index [AW] Rebecka Apley, MD     ____________________________________________   FINAL CLINICAL IMPRESSION(S) / ED DIAGNOSES  Final diagnoses:  Contusion of left foot, initial encounter  Laceration of left foot, initial encounter      NEW MEDICATIONS STARTED DURING THIS VISIT:  Discharge Medication List as of 06/13/2016  2:52 AM    START taking these medications   Details  !! traMADol (ULTRAM) 50 MG tablet Take 1 tablet (50 mg total) by mouth every 6 (six) hours as needed., Starting Mon 06/13/2016, Print     !! - Potential duplicate medications found. Please discuss with provider.       Note:  This document was prepared using Dragon voice recognition software and may include unintentional dictation errors.    Rebecka Apley, MD 06/13/16 [phone]

## 2017-12-23 IMAGING — CR DG ANKLE COMPLETE 3+V*L*
3 series · 3 of 3 positions shown · non-contrast
Comparison: None.

CLINICAL DATA: Trip and fall going down stairs this evening
injuring left ankle. Left ankle pain and swelling.

EXAM:
LEFT ANKLE COMPLETE - 3+ VIEW

[ankle ap]
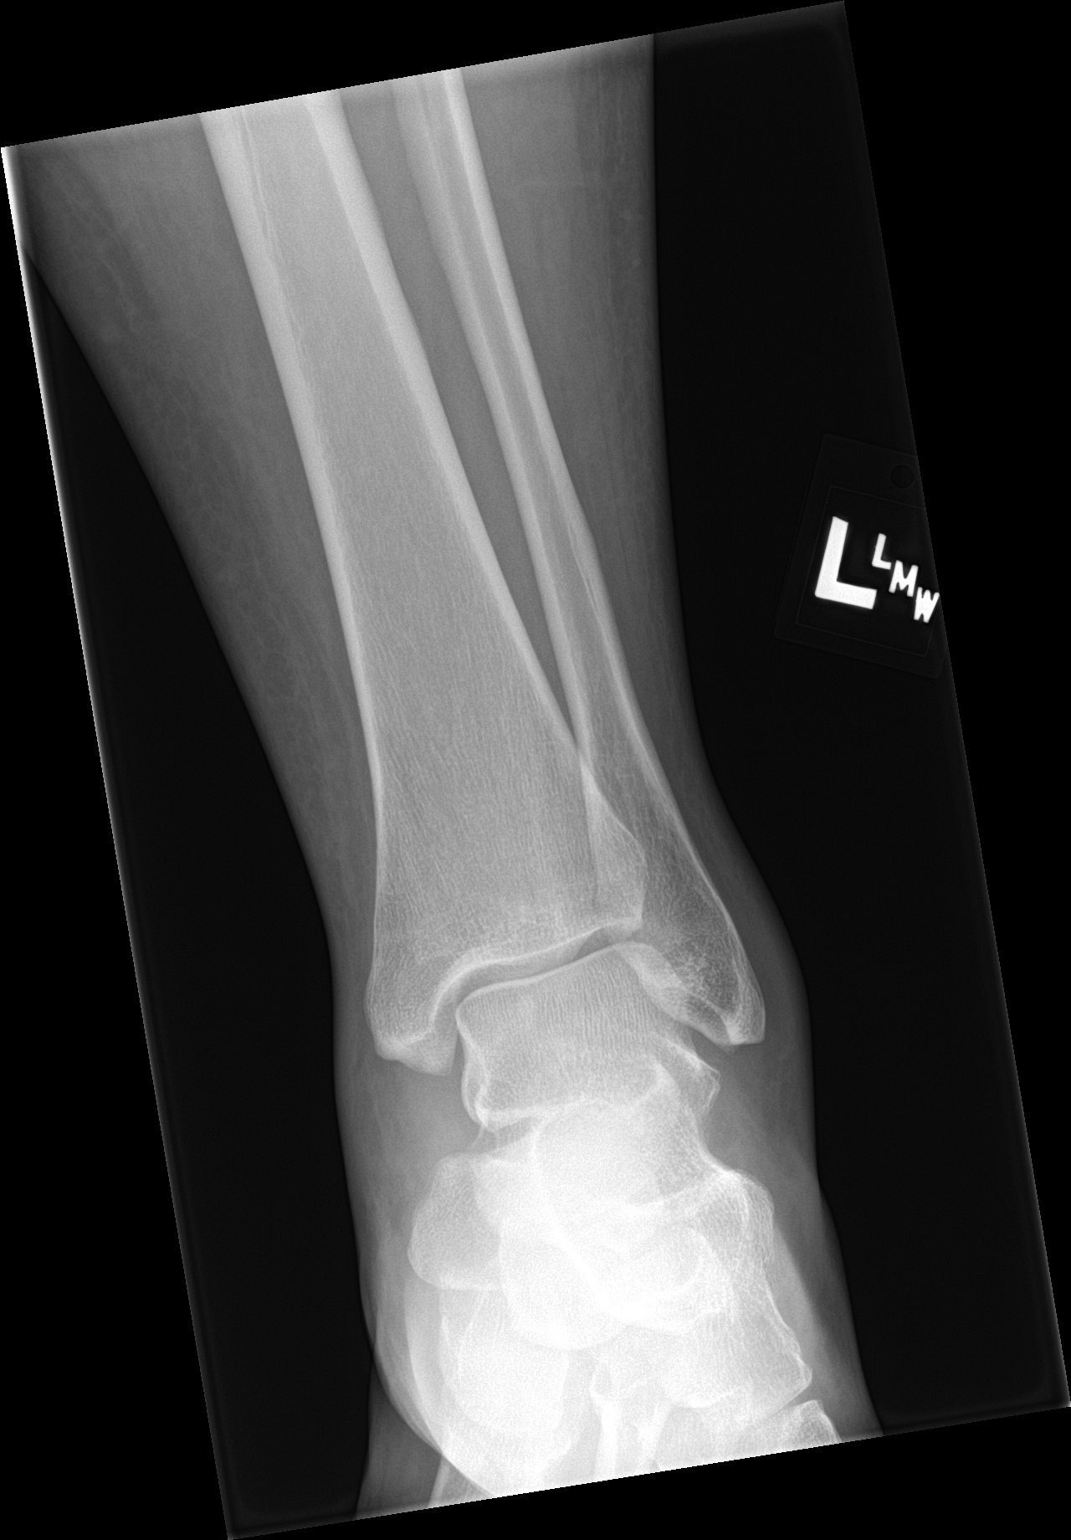

[ankle obl]
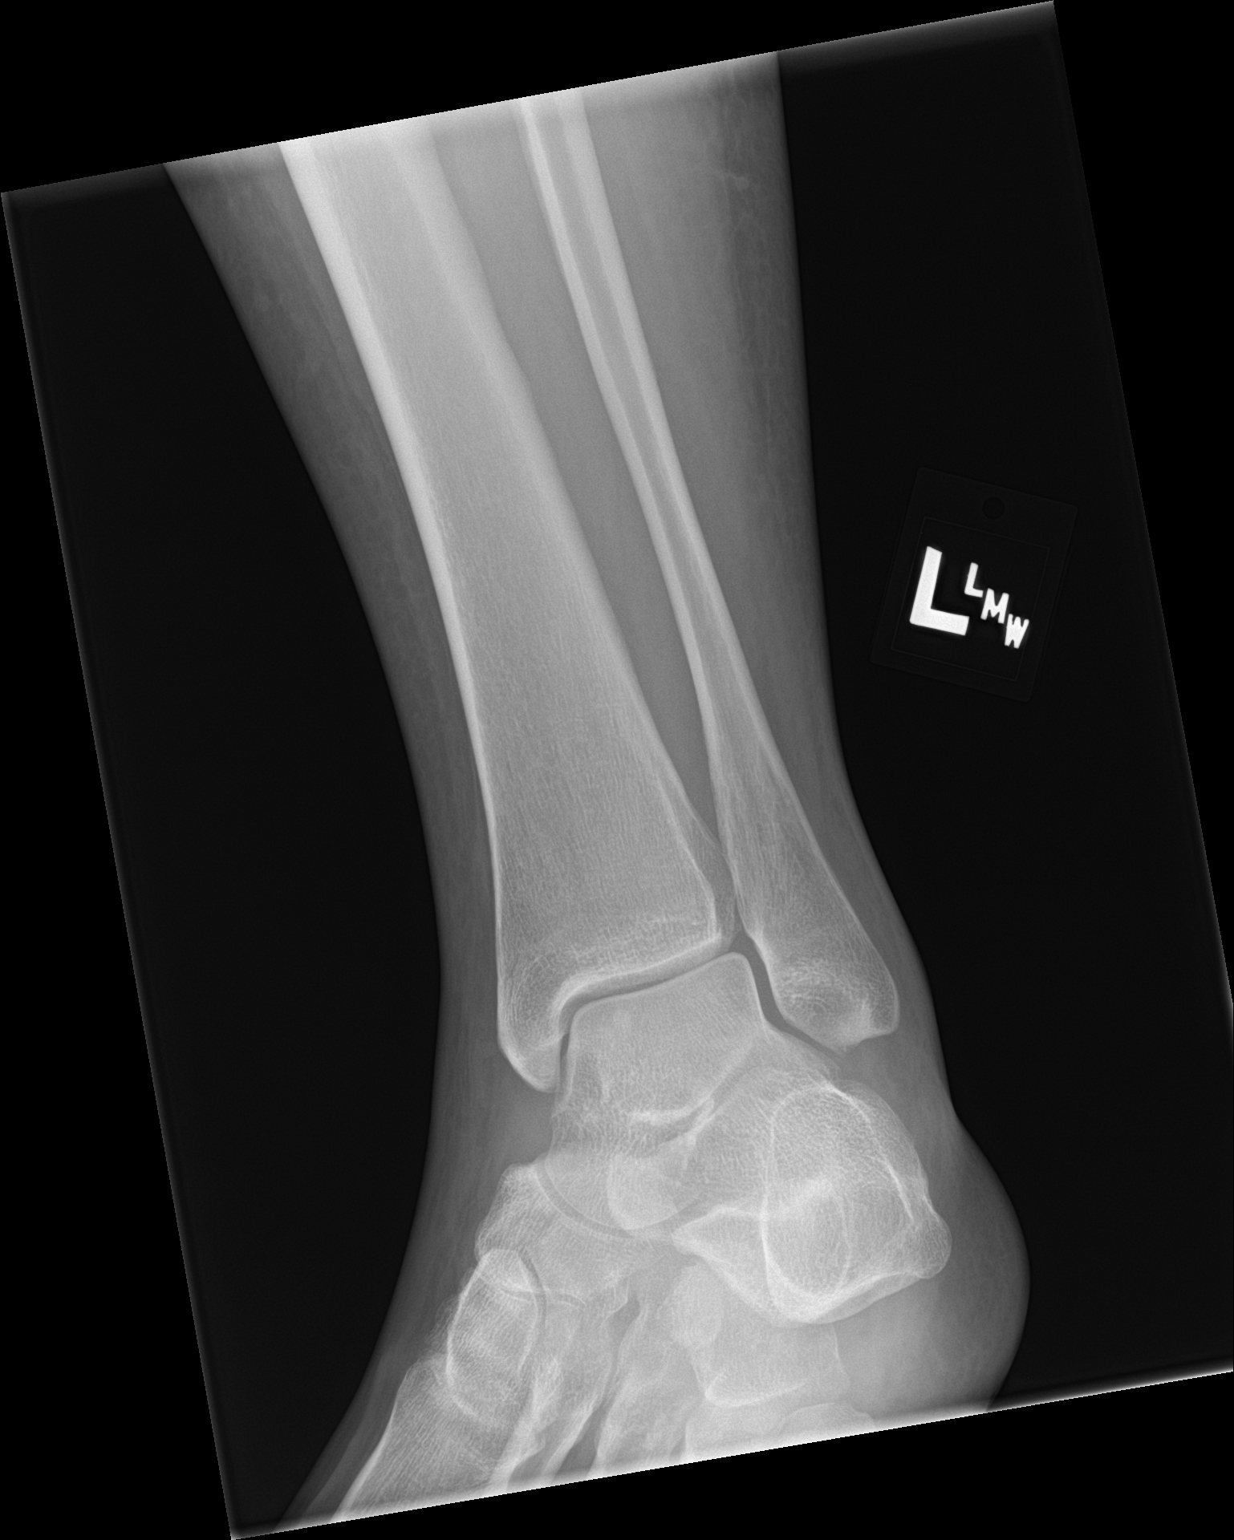

[ankle lat]
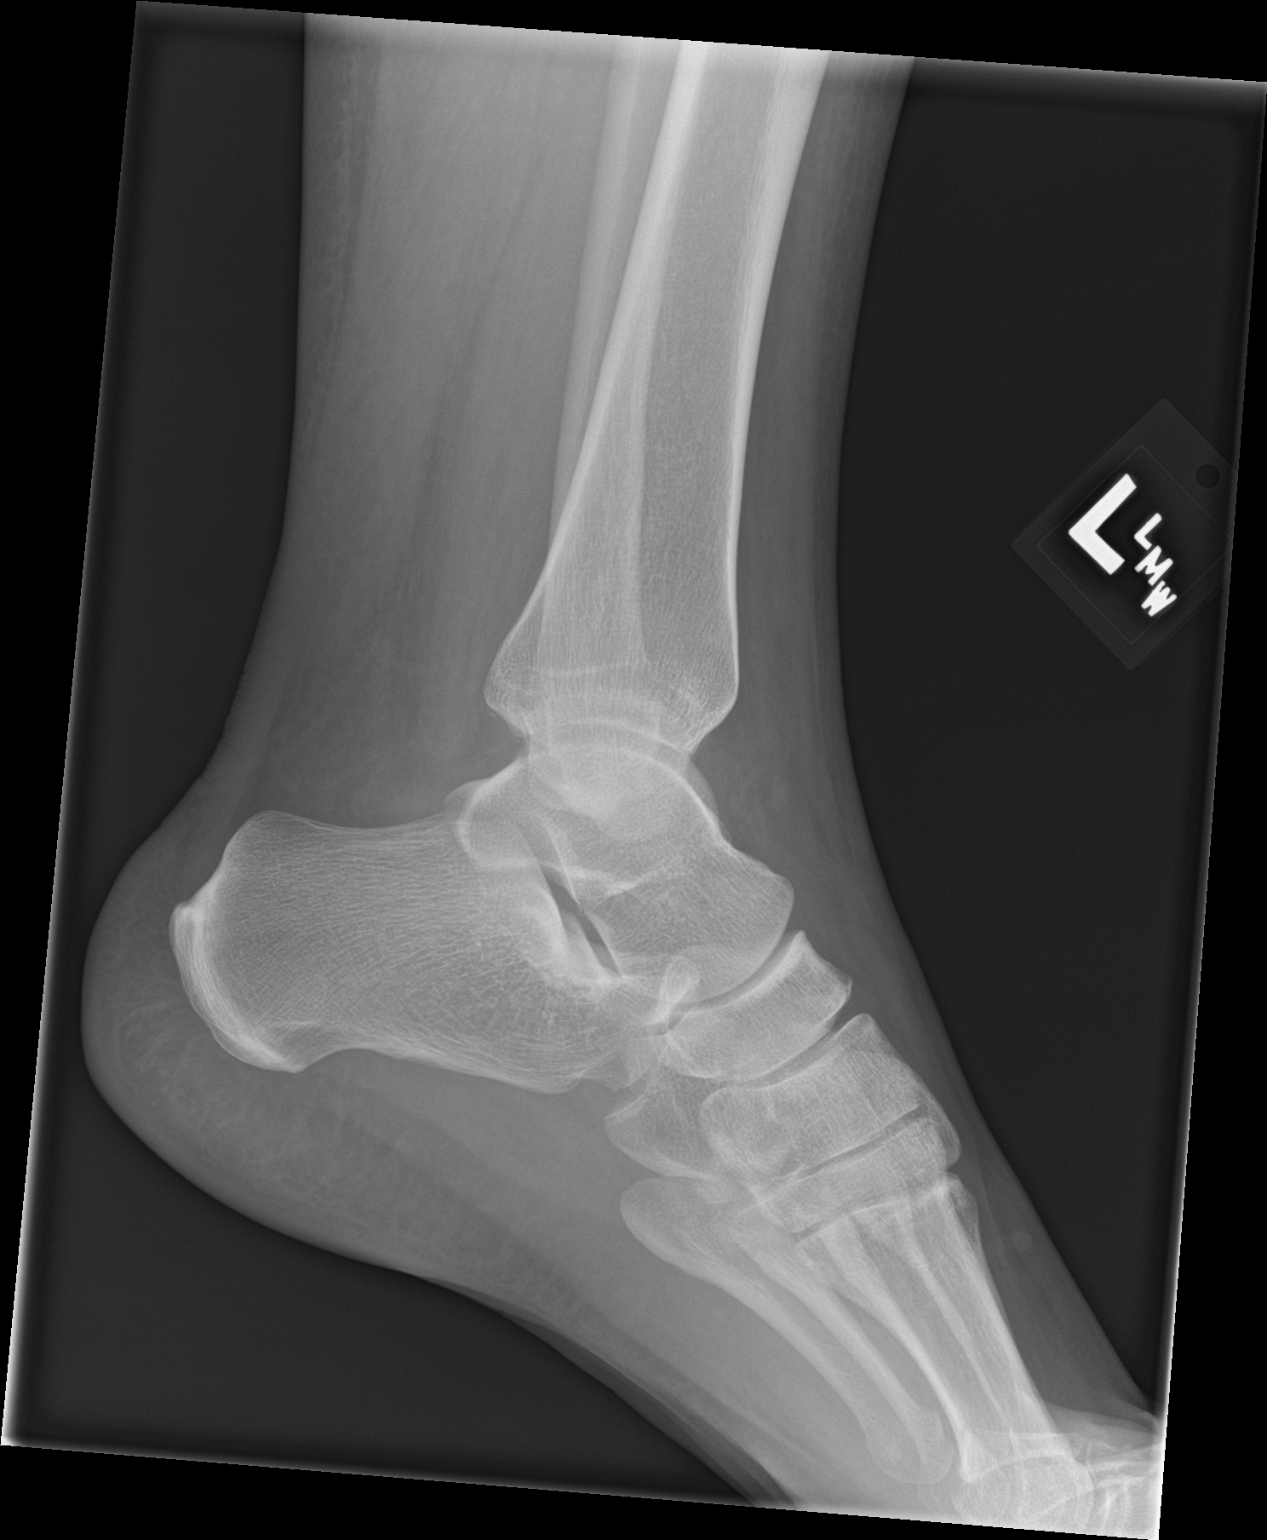

[3 of 3 positions shown; findings below may reference images not displayed]

FINDINGS: There is no evidence of fracture, dislocation, or joint effusion.
The ankle mortise is preserved. No evidence of arthropathy. Mild
diffuse soft tissue edema.
IMPRESSION: Soft tissue edema without acute fracture or subluxation.

## 2018-08-16 IMAGING — DX DG FOOT COMPLETE 3+V*L*
3 series · 3 of 3 positions shown · non-contrast
Comparison: 10/21/2015 left ankle radiographs

CLINICAL DATA: Left foot injury. Large plasty container fell on
foot. Small wound.

EXAM:
LEFT FOOT - COMPLETE 3+ VIEW

[foot ap]
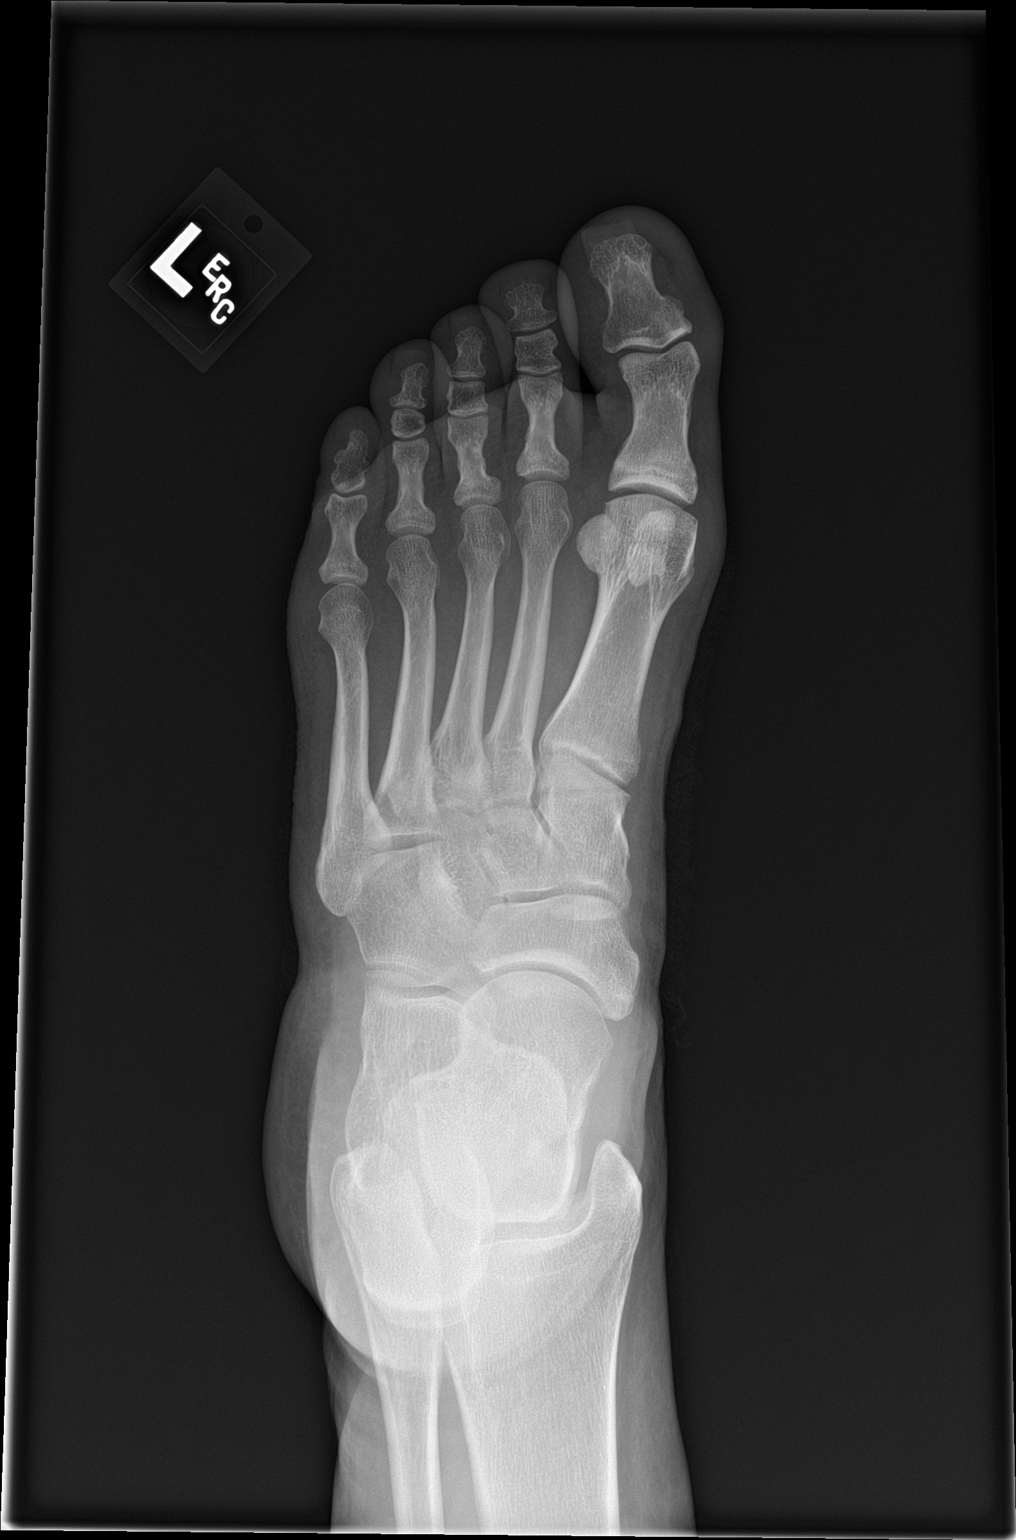

[foot obl]
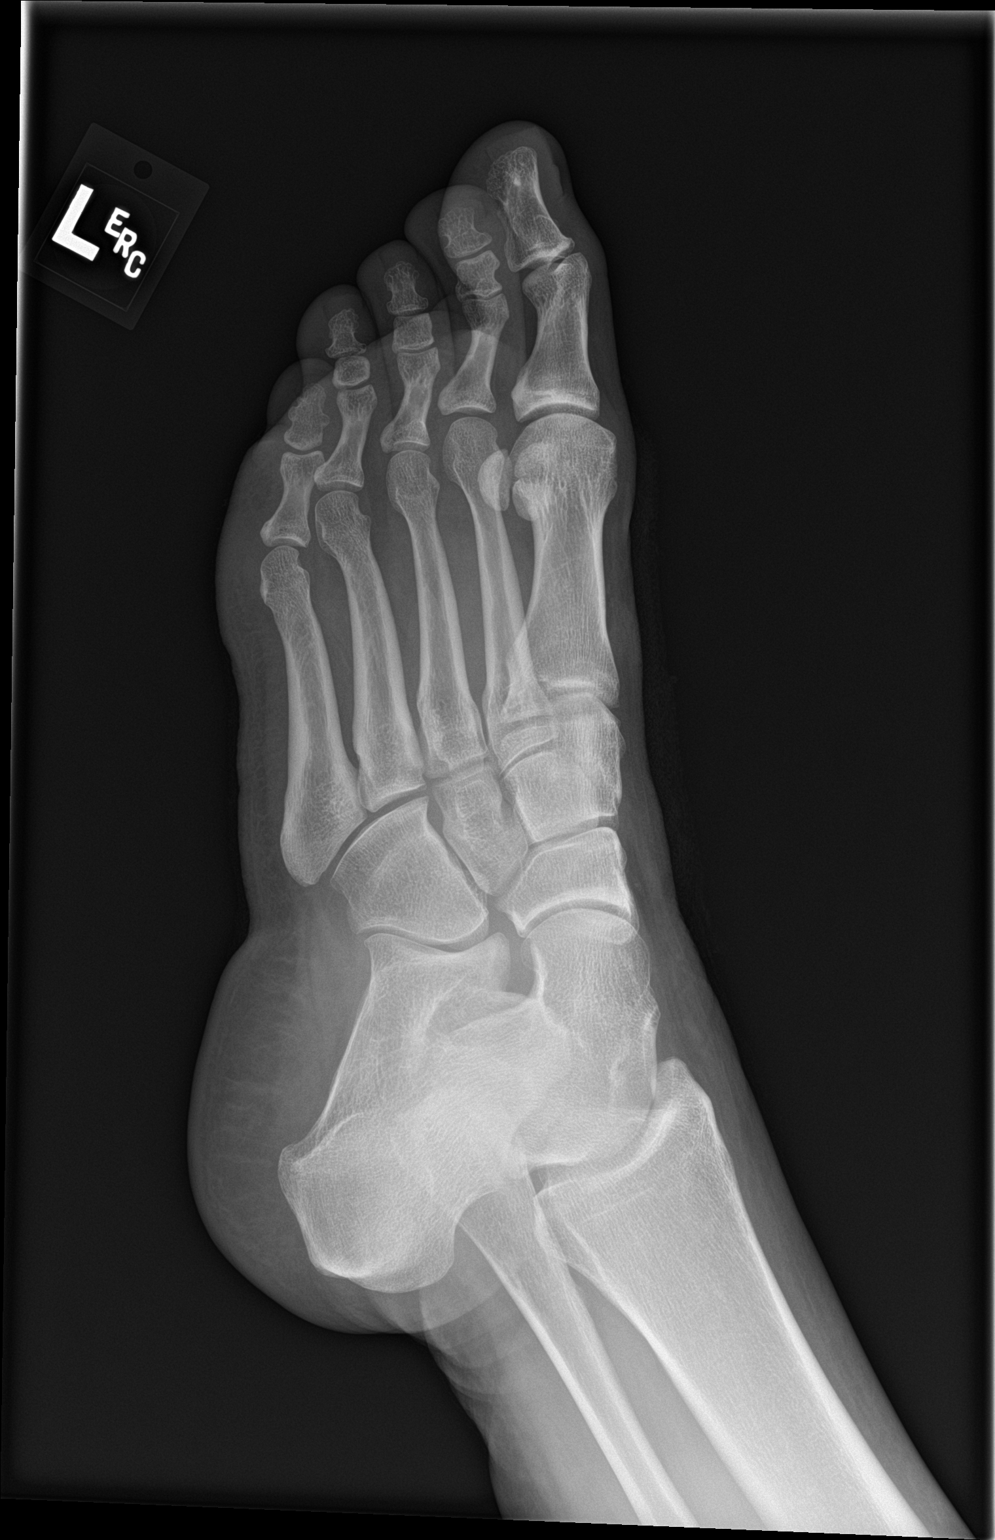

[foot lat]
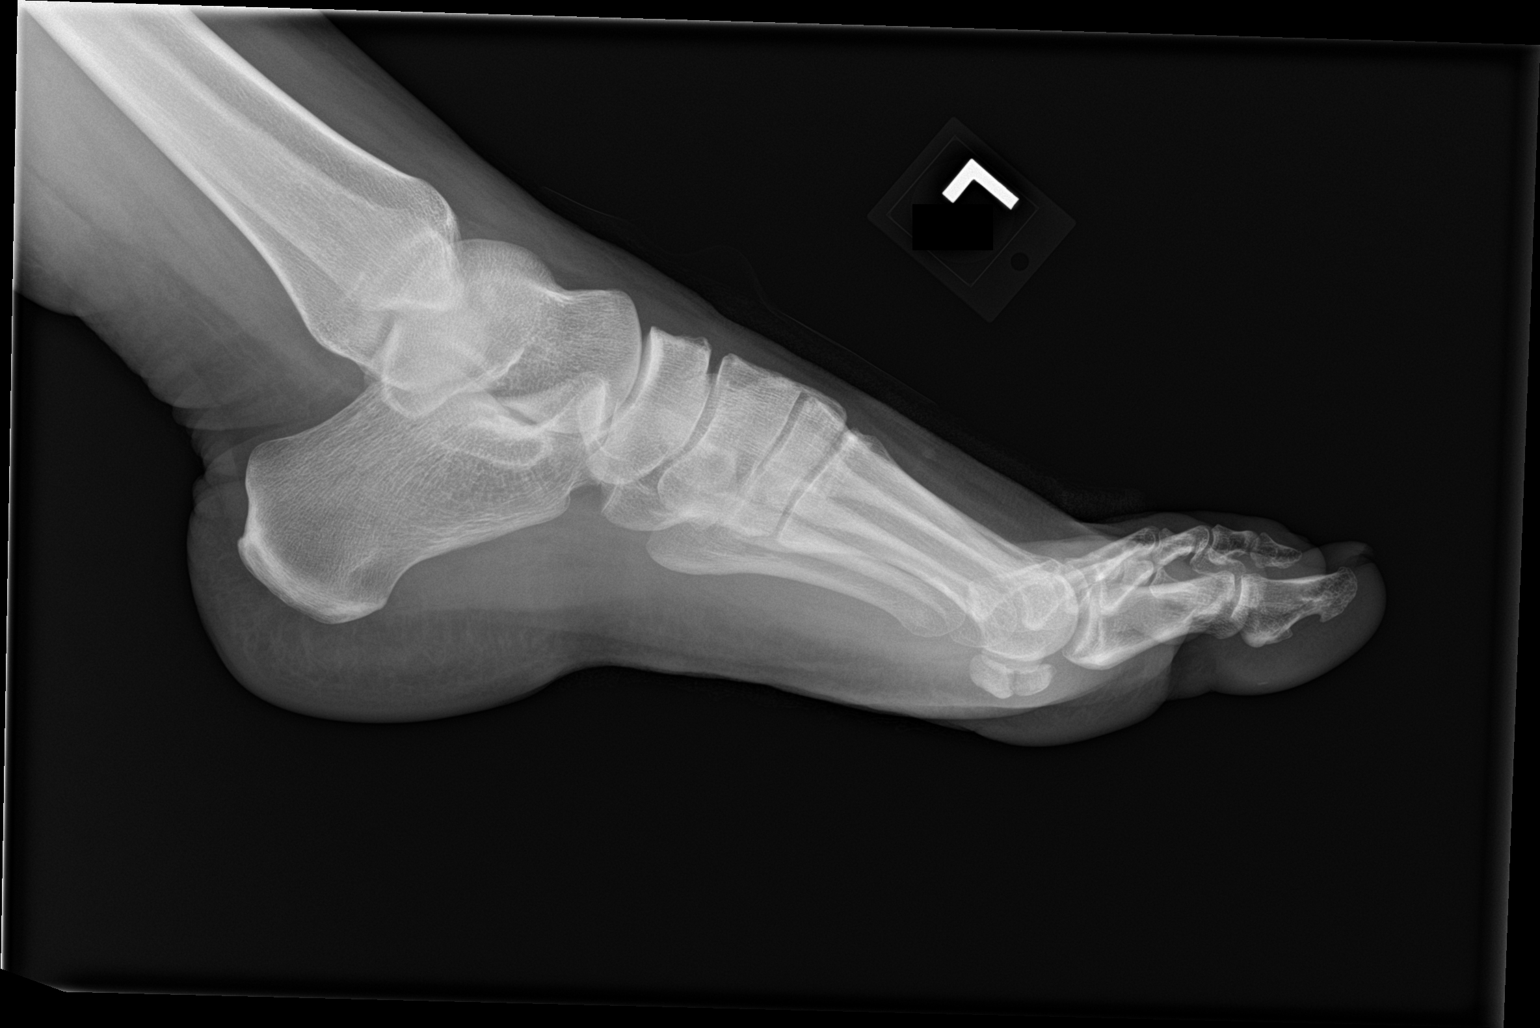

[3 of 3 positions shown; findings below may reference images not displayed]

FINDINGS: There is no evidence of fracture or dislocation. There is no
evidence of arthropathy or other focal bone abnormality. Soft
tissues are unremarkable. No radiopaque foreign body or definite
laceration identified radiographically appear
IMPRESSION: Negative for acute fracture, radiopaque foreign body or significant
soft tissue swelling.

## 2020-03-24 ENCOUNTER — Other Ambulatory Visit: Payer: Self-pay

## 2021-02-23 ENCOUNTER — Emergency Department
Admission: EM | Admit: 2021-02-23 | Discharge: 2021-02-23 | Disposition: A | Discharge status: Home / Self Care | Payer: Medicaid Other | Attending: Emergency Medicine | Admitting: Emergency Medicine

## 2021-02-23 ENCOUNTER — Emergency Department: Payer: Medicaid Other

## 2021-02-23 ENCOUNTER — Other Ambulatory Visit: Payer: Self-pay

## 2021-02-23 DIAGNOSIS — F419 Anxiety disorder, unspecified: Secondary | ICD-10-CM | POA: Diagnosis not present

## 2021-02-23 DIAGNOSIS — R0602 Shortness of breath: Secondary | ICD-10-CM | POA: Insufficient documentation

## 2021-02-23 DIAGNOSIS — Z5321 Procedure and treatment not carried out due to patient leaving prior to being seen by health care provider: Secondary | ICD-10-CM | POA: Insufficient documentation

## 2021-02-23 LAB — CBC
HCT: 31.3 % — ABNORMAL LOW (ref 36.0–46.0)
Hemoglobin: 8.4 g/dL — ABNORMAL LOW (ref 12.0–15.0)
MCH: 19.2 pg — ABNORMAL LOW (ref 26.0–34.0)
MCHC: 26.8 g/dL — ABNORMAL LOW (ref 30.0–36.0)
MCV: 71.6 fL — ABNORMAL LOW (ref 80.0–100.0)
Platelets: 140 10*3/uL — ABNORMAL LOW (ref 150–400)
RBC: 4.37 MIL/uL (ref 3.87–5.11)
RDW: 18.1 % — ABNORMAL HIGH (ref 11.5–15.5)
WBC: 6.5 10*3/uL (ref 4.0–10.5)
nRBC: 0 % (ref 0.0–0.2)

## 2021-02-23 LAB — BASIC METABOLIC PANEL
Anion gap: 4 — ABNORMAL LOW (ref 5–15)
BUN: 12 mg/dL (ref 6–20)
CO2: 27 mmol/L (ref 22–32)
Calcium: 8.8 mg/dL — ABNORMAL LOW (ref 8.9–10.3)
Chloride: 106 mmol/L (ref 98–111)
Creatinine, Ser: 0.8 mg/dL (ref 0.44–1.00)
GFR, Estimated: >60 mL/min (ref >60)
Glucose, Bld: 110 mg/dL — ABNORMAL HIGH (ref 70–99)
Potassium: 3.6 mmol/L (ref 3.5–5.1)
Sodium: 137 mmol/L (ref 135–145)

## 2021-02-23 LAB — TROPONIN I (HIGH SENSITIVITY): Troponin I (High Sensitivity): 3 ng/L (ref <18)

## 2021-02-23 NOTE — ED Triage Notes (Signed)
Pt states that chest pressure woke her up from her sleep tonight and that she feels short of breath. States she is feeling anxious

## 2022-02-14 ENCOUNTER — Ambulatory Visit
Admission: RE | Admit: 2022-02-14 | Discharge: 2022-02-14 | Disposition: A | Discharge status: Home / Self Care | Payer: Medicaid Other | Source: Ambulatory Visit

## 2022-02-14 VITALS — BP 142/91 | HR 83 | Temp 98.4°F | Ht 66.0 in | Wt 169.4 lb

## 2022-02-14 DIAGNOSIS — J0140 Acute pansinusitis, unspecified: Secondary | ICD-10-CM | POA: Diagnosis not present

## 2022-02-14 MED ORDER — IPRATROPIUM BROMIDE 0.06 % NA SOLN: 2.0000 | Freq: Four times a day (QID) | NASAL | 12 refills | Status: AC

## 2022-02-14 MED ORDER — BENZONATATE 100 MG PO CAPS: 200.0000 mg | ORAL_CAPSULE | Freq: Three times a day (TID) | ORAL | 0 refills | Status: AC

## 2022-02-14 MED ORDER — PROMETHAZINE-DM 6.25-15 MG/5ML PO SYRP: 5.0000 mL | ORAL_SOLUTION | Freq: Four times a day (QID) | ORAL | 0 refills | Status: AC | PRN

## 2022-02-14 MED ORDER — AMOXICILLIN-POT CLAVULANATE 875-125 MG PO TABS: 1.0000 | ORAL_TABLET | Freq: Two times a day (BID) | ORAL | 0 refills | Status: AC

## 2022-02-14 NOTE — ED Provider Notes (Signed)
[facility]    CSN: [id number] Arrival date & time: 02/14/22  1043      History   Chief Complaint Chief Complaint  Patient presents with   Ear Fullness    Having severe ear pain with loss of hearing. - Entered by patient   Chest Congestion    HPI Rachel Baird is a 42 y.o. female.   HPI  42 year old female here for evaluation of respiratory complaints.  The patient reports that she has been experiencing bilateral ear pain and chest congestion for the last 2 weeks.  She states she had been running a fever but she has not had a fever in the last 4 to 5 days.  She does endorse a sore throat that is also resolved.  She continues to have runny nose and nasal congestion with green nasal discharge, a productive cough for green sputum, and diarrhea.  She denies any shortness of breath or wheezing.  She also denies nausea or vomiting.  Past Medical History:  Diagnosis Date   Depression    self d/ced zoloft age 89   Headache    Hypothyroidism    d/ced synthroid age 23; had normal TFTs 08/2014   Vaginal Pap smear, abnormal     Patient Active Problem List   Diagnosis Date Noted   Postpartum care following vaginal delivery 02/02/2015   Bradycardia on ECG 02/01/2015   Labor and delivery, indication for care 01/30/2015   Thrombocytopenia ([place]) 01/01/2015   Abnormal cervical Pap smear with positive HPV DNA test 11/03/2014    Past Surgical History:  Procedure Laterality Date   CHOLECYSTECTOMY     MINI-LAPAROTOMY W/ TUBAL LIGATION  01/30/2014   TUBAL LIGATION Bilateral 01/31/2015   Procedure: BILATERAL TUBAL LIGATION;  Surgeon: Aletha Halim, MD;  Location: [facility] ORS;  Service: Gynecology;  Laterality: Bilateral;    OB History     Gravida  6   Para  4   Term  4   Preterm  0   AB      Living  4      SAB      IAB      Ectopic      Multiple  0   Live Births  4            Home Medications    Prior to Admission medications   Medication Sig  Start Date End Date Taking? Authorizing Provider  amoxicillin-clavulanate (AUGMENTIN) 875-125 MG tablet Take 1 tablet by mouth every 12 (twelve) hours for 10 days. 02/14/22 02/24/22 Yes Margarette Canada, NP  benzonatate (TESSALON) 100 MG capsule Take 2 capsules (200 mg total) by mouth every 8 (eight) hours. 02/14/22  Yes Margarette Canada, NP  gabapentin (NEURONTIN) 600 MG tablet Take 1,200 mg by mouth 2 (two) times daily. 02/13/22  Yes [provider]  ipratropium (ATROVENT) 0.06 % nasal spray Place 2 sprays into both nostrils 4 (four) times daily. 02/14/22  Yes Margarette Canada, NP  promethazine-dextromethorphan (PROMETHAZINE-DM) 6.25-15 MG/5ML syrup Take 5 mLs by mouth 4 (four) times daily as needed. 02/14/22  Yes Margarette Canada, NP  SUBOXONE 8-2 MG FILM Place under the tongue 3 (three) times daily. 01/26/22  Yes [provider]  hydrALAZINE (APRESOLINE) 10 MG tablet Take 1 tablet (10 mg total) by mouth 2 (two) times daily. 02/02/15   Rod Can, CNM  ibuprofen (ADVIL,MOTRIN) 600 MG tablet Take 1 tablet (600 mg total) by mouth every 8 (eight) hours as needed. 08/04/15   Sable Feil,  PA-C  methocarbamol (ROBAXIN-750) 750 MG tablet Take 1 tablet (750 mg total) by mouth 4 (four) times daily. 08/04/15   Sable Feil, PA-C  oxyCODONE-acetaminophen (PERCOCET/ROXICET) 5-325 MG tablet Take 1-2 tablets by mouth every 6 (six) hours as needed for severe pain. 02/02/15   Rod Can, CNM  Prenatal Vit-Fe Fumarate-FA (PRENATAL MULTIVITAMIN) TABS tablet Take 1 tablet by mouth daily at 12 noon.    [provider]  sertraline (ZOLOFT) 100 MG tablet Take 1 tablet (100 mg total) by mouth daily. 02/02/15   Rod Can, CNM  traMADol (ULTRAM) 50 MG tablet Take 1 tablet (50 mg total) by mouth every 6 (six) hours as needed. 10/21/15   Loney Hering, MD  traMADol (ULTRAM) 50 MG tablet Take 1 tablet (50 mg total) by mouth every 6 (six) hours as needed. 06/13/16   Loney Hering, MD    Family  History Family History  Problem Relation Age of Onset   COPD Mother    Heart disease Mother    Cancer Father    COPD Father    Diabetes Paternal Grandmother    Diabetes Paternal Grandfather     Social History Social History   Tobacco Use   Smoking status: Every Day    Packs/day: 0.25    Years: 10.00    Total pack years: 2.50    Types: Cigarettes   Smokeless tobacco: Never  Substance Use Topics   Alcohol use: No   Drug use: Yes    Types: Marijuana    Comment: Last use 2-3 weeks ago     Allergies   Minocycline   Review of Systems Review of Systems  Constitutional:  Positive for fever.  HENT:  Positive for congestion, ear pain, rhinorrhea, sinus pressure and sore throat.   Respiratory:  Positive for cough. Negative for shortness of breath and wheezing.   Gastrointestinal:  Positive for diarrhea. Negative for nausea and vomiting.     Physical Exam Triage Vital Signs ED Triage Vitals  Enc Vitals Group     BP 02/14/22 1108 (!) 142/91     Pulse Rate 02/14/22 1108 83     Resp --      Temp 02/14/22 1108 98.4 F (36.9 C)     Temp Source 02/14/22 1108 Oral     SpO2 02/14/22 1108 100 %     Weight 02/14/22 1116 169 lb 6.4 oz (76.8 kg)     Height 02/14/22 1107 5\' 6"  (1.676 m)     Head Circumference --      Peak Flow --      Pain Score 02/14/22 1106 6     Pain Loc --      Pain Edu? --      Excl. in [place]? --    No data found.  Updated Vital Signs BP (!) 142/91 (BP Location: Right Arm)   Pulse 83   Temp 98.4 F (36.9 C) (Oral)   Ht 5\' 6"  (1.676 m)   Wt 169 lb 6.4 oz (76.8 kg)   LMP 01/14/2022 (Approximate)   SpO2 100%   BMI 27.34 kg/m   Visual Acuity Right Eye Distance:   Left Eye Distance:   Bilateral Distance:    Right Eye Near:   Left Eye Near:    Bilateral Near:     Physical Exam Vitals and nursing note reviewed.  Constitutional:      Appearance: Normal appearance. She is not ill-appearing.  HENT:     Head: Normocephalic and atraumatic.  Right Ear: Tympanic membrane, ear canal and external ear normal. There is no impacted cerumen.     Left Ear: Tympanic membrane, ear canal and external ear normal. There is no impacted cerumen.     Nose: Congestion and rhinorrhea present.     Comments: Nasal mucosa is erythematous and edematous with yellow discharge in both nares.  Patient does have tenderness to compression of frontal and maxillary sinuses bilaterally.    Mouth/Throat:     Mouth: Mucous membranes are moist.     Pharynx: Oropharynx is clear. Posterior oropharyngeal erythema present. No oropharyngeal exudate.     Comments: Mild posterior oropharyngeal erythema with yellow postnasal drip. Cardiovascular:     Rate and Rhythm: Normal rate and regular rhythm.     Pulses: Normal pulses.     Heart sounds: Normal heart sounds. No murmur heard.    No friction rub. No gallop.  Pulmonary:     Effort: Pulmonary effort is normal.     Breath sounds: Normal breath sounds. No wheezing, rhonchi or rales.  Musculoskeletal:     Cervical back: Normal range of motion and neck supple.  Lymphadenopathy:     Cervical: No cervical adenopathy.  Skin:    General: Skin is warm and dry.     Capillary Refill: Capillary refill takes less than 2 seconds.     Findings: No erythema or rash.  Neurological:     General: No focal deficit present.     Mental Status: She is alert and oriented to person, place, and time.  Psychiatric:        Mood and Affect: Mood normal.        Behavior: Behavior normal.        Thought Content: Thought content normal.        Judgment: Judgment normal.      UC Treatments / Results  Labs (all labs ordered are listed, but only abnormal results are displayed) Labs Reviewed - No data to display  EKG   Radiology No results found.  Procedures Procedures (including critical care time)  Medications Ordered in UC Medications - No data to display  Initial Impression / Assessment and Plan / UC Course  I have  reviewed the triage vital signs and the nursing notes.  Pertinent labs & imaging results that were available during my care of the patient were reviewed by me and considered in my medical decision making (see chart for details).   Patient is a pleasant, nontoxic-appearing 42 year old female here for evaluation of 2 weeks worth of respiratory symptoms as outlined in HPI above.  On exam she does have inflamed nasal mucosa with yellow discharge.  Her posterior oropharynx is also erythematous with yellow postnasal drip.  Her cardiopulmonary exam bisque lung sounds in all fields.  Given the tenderness to her sinuses and upper respiratory inflammation, coupled with the 2 weeks duration, I suspect that she has a sinus infection.  I will treat her for pansinusitis with Augmentin 875 mg twice daily for 10 days.  I have also prescribed Atrovent nasal spray to help with her congestion.  Tessalon Perles and Promethazine DM cough syrup to help with cough and congestion.  She can use over-the-counter Tylenol and/or ibuprofen according to package instructions as needed for fever and pain.  Return precautions reviewed.  She denies need for work note.  She did ask for work note for her children's father who took off work to bring them to the urgent care today.     Final  Clinical Impressions(s) / UC Diagnoses   Final diagnoses:  Acute non-recurrent pansinusitis     Discharge Instructions      The Augmentin twice daily with food for 10 days for treatment of your sinusitis.  Perform sinus irrigation 2-3 times a day with a NeilMed sinus rinse kit and distilled water.  Do not use tap water.  You can use plain over-the-counter Mucinex every 6 hours to break up the stickiness of the mucus so your body can clear it.  Increase your oral fluid intake to thin out your mucus so that is also able for your body to clear more easily.  Take an over-the-counter probiotic, such as Culturelle-align-activia, 1 hour after each  dose of antibiotic to prevent diarrhea.  Use the Atrovent nasal spray, 2 squirts in each nostril every 6 hours, as needed for runny nose and postnasal drip.  Use the Tessalon Perles every 8 hours during the day.  Take them with a small sip of water.  They may give you some numbness to the base of your tongue or a metallic taste in your mouth, this is normal.  Use the Promethazine DM cough syrup at bedtime for cough and congestion.  It will make you drowsy so do not take it during the day.  If you develop any new or worsening symptoms return for reevaluation or see your primary care provider.      ED Prescriptions     Medication Sig Dispense Auth. Provider   amoxicillin-clavulanate (AUGMENTIN) 875-125 MG tablet Take 1 tablet by mouth every 12 (twelve) hours for 10 days. 20 tablet Becky Augusta, NP   benzonatate (TESSALON) 100 MG capsule Take 2 capsules (200 mg total) by mouth every 8 (eight) hours. 21 capsule Becky Augusta, NP   ipratropium (ATROVENT) 0.06 % nasal spray Place 2 sprays into both nostrils 4 (four) times daily. 15 mL Becky Augusta, NP   promethazine-dextromethorphan (PROMETHAZINE-DM) 6.25-15 MG/5ML syrup Take 5 mLs by mouth 4 (four) times daily as needed. 118 mL Becky Augusta, NP      PDMP not reviewed this encounter.   Becky Augusta, NP 02/14/22 1210

## 2022-02-14 NOTE — ED Triage Notes (Addendum)
Pt c/o chest congestion, bilateral ears stopped up onset x2 weeks ago, pt reports negative home covid test x2 days ago

## 2022-02-14 NOTE — Discharge Instructions (Signed)
The Augmentin twice daily with food for 10 days for treatment of your sinusitis. ? ?Perform sinus irrigation 2-3 times a day with a NeilMed sinus rinse kit and distilled water.  Do not use tap water. ? ?You can use plain over-the-counter Mucinex every 6 hours to break up the stickiness of the mucus so your body can clear it. ? ?Increase your oral fluid intake to thin out your mucus so that is also able for your body to clear more easily. ? ?Take an over-the-counter probiotic, such as Culturelle-align-activia, 1 hour after each dose of antibiotic to prevent diarrhea. ? ?Use the Atrovent nasal spray, 2 squirts in each nostril every 6 hours, as needed for runny nose and postnasal drip. ? ?Use the Tessalon Perles every 8 hours during the day.  Take them with a small sip of water.  They may give you some numbness to the base of your tongue or a metallic taste in your mouth, this is normal. ? ?Use the Promethazine DM cough syrup at bedtime for cough and congestion.  It will make you drowsy so do not take it during the day. ? ?If you develop any new or worsening symptoms return for reevaluation or see your primary care provider.  ?

## 2022-02-23 DIAGNOSIS — F1120 Opioid dependence, uncomplicated: Secondary | ICD-10-CM | POA: Diagnosis not present

## 2022-03-23 DIAGNOSIS — F1120 Opioid dependence, uncomplicated: Secondary | ICD-10-CM | POA: Diagnosis not present

## 2022-03-28 ENCOUNTER — Ambulatory Visit: Payer: Self-pay

## 2022-05-18 DIAGNOSIS — F1120 Opioid dependence, uncomplicated: Secondary | ICD-10-CM | POA: Diagnosis not present

## 2022-06-29 DIAGNOSIS — F1120 Opioid dependence, uncomplicated: Secondary | ICD-10-CM | POA: Diagnosis not present

## 2022-07-27 DIAGNOSIS — F1120 Opioid dependence, uncomplicated: Secondary | ICD-10-CM | POA: Diagnosis not present

## 2022-08-24 DIAGNOSIS — F1120 Opioid dependence, uncomplicated: Secondary | ICD-10-CM | POA: Diagnosis not present

## 2022-09-21 DIAGNOSIS — F1120 Opioid dependence, uncomplicated: Secondary | ICD-10-CM | POA: Diagnosis not present

## 2022-10-19 DIAGNOSIS — F1120 Opioid dependence, uncomplicated: Secondary | ICD-10-CM | POA: Diagnosis not present

## 2022-11-03 ENCOUNTER — Emergency Department
Admission: EM | Admit: 2022-11-03 | Discharge: 2022-11-03 | Disposition: A | Discharge status: Home / Self Care | Payer: 59 | Attending: Emergency Medicine | Admitting: Emergency Medicine

## 2022-11-03 ENCOUNTER — Encounter: Payer: Self-pay | Admitting: Emergency Medicine

## 2022-11-03 ENCOUNTER — Emergency Department: Payer: 59

## 2022-11-03 ENCOUNTER — Other Ambulatory Visit: Payer: Self-pay

## 2022-11-03 DIAGNOSIS — L03115 Cellulitis of right lower limb: Secondary | ICD-10-CM | POA: Insufficient documentation

## 2022-11-03 DIAGNOSIS — M7989 Other specified soft tissue disorders: Secondary | ICD-10-CM | POA: Diagnosis not present

## 2022-11-03 DIAGNOSIS — M25571 Pain in right ankle and joints of right foot: Secondary | ICD-10-CM | POA: Diagnosis not present

## 2022-11-03 MED ORDER — IBUPROFEN 600 MG PO TABS
600.0000 mg | ORAL_TABLET | Freq: Once | ORAL | Status: AC
  Administered 2022-11-03: 600 mg via ORAL
  Filled 2022-11-03: qty 1

## 2022-11-03 MED ORDER — SULFAMETHOXAZOLE-TRIMETHOPRIM 800-160 MG PO TABS: 1.0000 | ORAL_TABLET | Freq: Two times a day (BID) | ORAL | 0 refills | Status: AC

## 2022-11-03 MED ORDER — CEPHALEXIN 500 MG PO CAPS: 500.0000 mg | ORAL_CAPSULE | Freq: Four times a day (QID) | ORAL | 0 refills | Status: AC

## 2022-11-03 NOTE — Discharge Instructions (Addendum)
Please follow-up with your outpatient provider.  If the redness gets larger, or if your ankle pain gets worse, or if you develop a fever or red streak up your leg, please return to the emergency department immediately.  Please take the antibiotics as prescribed.  Please return for any new, worsening, or change in symptoms or other concerns.  It was a pleasure caring for you today.

## 2022-11-03 NOTE — ED Provider Notes (Signed)
[facility] Provider Note    Event Date/Time   First MD Initiated Contact with Patient 11/03/22 1808     (approximate)   History   Ankle Pain   HPI  Rachel Baird is a 42 y.o. female who presents today for evaluation of right ankle pain.  Patient reports that she thinks that she twisted her ankle while getting out of bed and scraped her ankle when she did so.  She reports that she has developed a small amount of redness around her ankle which has been causing her pain.  No fevers or chills.  She is still able to ambulate and move her ankle.  She denies any history of immunocompromising diseases.  She denies history of gout.  Patient Active Problem List   Diagnosis Date Noted   Postpartum care following vaginal delivery 02/02/2015   Bradycardia on ECG 02/01/2015   Labor and delivery, indication for care 01/30/2015   Thrombocytopenia (HCC) 01/01/2015   Abnormal cervical Pap smear with positive HPV DNA test 11/03/2014          Physical Exam   Triage Vital Signs: ED Triage Vitals  Encounter Vitals Group     BP 11/03/22 1702 134/65     Systolic BP Percentile --      Diastolic BP Percentile --      Pulse Rate 11/03/22 1702 61     Resp 11/03/22 1702 16     Temp 11/03/22 1702 98 F (36.7 C)     Temp Source 11/03/22 1702 Oral     SpO2 11/03/22 1702 97 %     Weight 11/03/22 1703 180 lb (81.6 kg)     Height 11/03/22 1703 5\' 6"  (1.676 m)     Head Circumference --      Peak Flow --      Pain Score 11/03/22 1704 6     Pain Loc --      Pain Education --      Exclude from Growth Chart --     Most recent vital signs: Vitals:   11/03/22 1702  BP: 134/65  Pulse: 61  Resp: 16  Temp: 98 F (36.7 C)  SpO2: 97%    Physical Exam Vitals and nursing note reviewed.  Constitutional:      General: Awake and alert. No acute distress.    Appearance: Normal appearance. The patient is normal weight.  HENT:     Head: Normocephalic and atraumatic.      Mouth: Mucous membranes are moist.  Eyes:     General: PERRL. Normal EOMs        Right eye: No discharge.        Left eye: No discharge.     Conjunctiva/sclera: Conjunctivae normal.  Cardiovascular:     Rate and Rhythm: Normal rate and regular rhythm.     Pulses: Normal pulses.  Pulmonary:     Effort: Pulmonary effort is normal. No respiratory distress.     Breath sounds: Normal breath sounds.  Abdominal:     Abdomen is soft. There is no abdominal tenderness. No rebound or guarding. No distention. Musculoskeletal:        General: No swelling. Normal range of motion.     Cervical back: Normal range of motion and neck supple.  Right ankle: Medial aspect of ankle with superficial abrasion just lateral to the medial malleolus with surrounding erythema extending approximately 3 x 3 cm.  She has normal pedal pulses.  She is able to flex  and extend and invert and evert ankle against resistance.  There is no lymphangitis.  There is no crepitus.  No erythema elsewhere.  No significant swelling. Skin:    General: Skin is warm and dry.     Capillary Refill: Capillary refill takes less than 2 seconds.     Findings: No rash.  Neurological:     Mental Status: The patient is awake and alert.      ED Results / Procedures / Treatments   Labs (all labs ordered are listed, but only abnormal results are displayed) Labs Reviewed - No data to display   EKG     RADIOLOGY I independently reviewed and interpreted imaging and agree with radiologists findings.     PROCEDURES:  Critical Care performed:   Procedures   MEDICATIONS ORDERED IN ED: Medications  ibuprofen (ADVIL) tablet 600 mg (600 mg Oral Given 11/03/22 1834)     IMPRESSION / MDM / ASSESSMENT AND PLAN / ED COURSE  I reviewed the triage vital signs and the nursing notes.   Differential diagnosis includes, but is not limited to, sprain, cellulitis, gout.  Patient is awake and alert, hemodynamically stable and afebrile.   She is nontoxic in appearance.  She has a small abrasion where she scraped her ankle to the medial malleoli are aspect, with surrounding erythema suspicious for developing cellulitis.  There is no discharge or drainage.  There is no area of fluctuance, no induration, no lymphangitis.  She is still able to plantarflex and dorsiflex her ankle against resistance, she is afebrile without antipyretics, I do not suspect a septic joint.  She has no history of gout.  She was started on antibiotics.  X-ray obtained in triage does not reveal any obvious osseous abnormality.  We discussed strict return precautions for signs of worsening infection, or worsening ankle pain that could suggest a septic joint, and the importance was outpatient follow-up for wound recheck in approximately 2 days.  She does not have any history of immunocompromising diseases, she is not diabetic.  She was given a dose of ibuprofen per her request.  Patient understands and agrees with plan.  She was discharged in stable condition.   Patient's presentation is most consistent with acute complicated illness / injury requiring diagnostic workup.      FINAL CLINICAL IMPRESSION(S) / ED DIAGNOSES   Final diagnoses:  Acute right ankle pain  Cellulitis of right lower extremity     Rx / DC Orders   ED Discharge Orders          Ordered    cephALEXin (KEFLEX) 500 MG capsule  4 times daily        11/03/22 1819    sulfamethoxazole-trimethoprim (BACTRIM DS) 800-160 MG tablet  2 times daily        11/03/22 1819             Note:  This document was prepared using Dragon voice recognition software and may include unintentional dictation errors.   Keturah Shavers 11/03/22 Rivka Barbara, MD 11/04/22 [phone]

## 2022-11-03 NOTE — ED Triage Notes (Signed)
Pt presents with right ankle swelling and pain for the past 2 days.  POssibly twisted it getting into bed.  Noted swelling at time of triage.

## 2022-11-08 ENCOUNTER — Telehealth: Payer: Self-pay | Admitting: Emergency Medicine

## 2022-11-08 NOTE — Telephone Encounter (Signed)
Called patient to inform of radiology read result for right ankle xrays show possible fracture.  Per Dr. Virginia Rochester should be on crutches and cam boot until seen by ortho.  [facility] on call for day of visit.  Paitent instructed to call for appt.  Informed she can always return here for the boot and crutches, but it would create another ED visit.  Informed that she can go to [facility] to be seen for that to avoid another ED visit.

## 2022-11-16 DIAGNOSIS — F1120 Opioid dependence, uncomplicated: Secondary | ICD-10-CM | POA: Diagnosis not present

## 2022-12-14 DIAGNOSIS — F1120 Opioid dependence, uncomplicated: Secondary | ICD-10-CM | POA: Diagnosis not present

## 2022-12-14 DIAGNOSIS — F1920 Other psychoactive substance dependence, uncomplicated: Secondary | ICD-10-CM | POA: Diagnosis not present

## 2022-12-29 DIAGNOSIS — J069 Acute upper respiratory infection, unspecified: Secondary | ICD-10-CM | POA: Diagnosis not present

## 2022-12-29 DIAGNOSIS — Z1389 Encounter for screening for other disorder: Secondary | ICD-10-CM | POA: Diagnosis not present

## 2022-12-30 ENCOUNTER — Other Ambulatory Visit: Payer: Self-pay | Admitting: Family Medicine

## 2022-12-30 DIAGNOSIS — Z1231 Encounter for screening mammogram for malignant neoplasm of breast: Secondary | ICD-10-CM

## 2023-01-18 DIAGNOSIS — F1920 Other psychoactive substance dependence, uncomplicated: Secondary | ICD-10-CM | POA: Diagnosis not present

## 2023-04-21 ENCOUNTER — Encounter

## 2023-05-09 ENCOUNTER — Ambulatory Visit: Payer: Self-pay

## 2024-01-24 ENCOUNTER — Other Ambulatory Visit: Payer: Self-pay

## 2024-01-24 DIAGNOSIS — E039 Hypothyroidism, unspecified: Secondary | ICD-10-CM | POA: Insufficient documentation

## 2024-01-24 DIAGNOSIS — R059 Cough, unspecified: Secondary | ICD-10-CM | POA: Diagnosis not present

## 2024-01-24 DIAGNOSIS — M791 Myalgia, unspecified site: Secondary | ICD-10-CM | POA: Diagnosis present

## 2024-01-24 DIAGNOSIS — R0602 Shortness of breath: Secondary | ICD-10-CM | POA: Insufficient documentation

## 2024-01-24 LAB — RESP PANEL BY RT-PCR (RSV, FLU A&B, COVID)  RVPGX2
Influenza A by PCR: NEGATIVE
Influenza B by PCR: NEGATIVE
Resp Syncytial Virus by PCR: NEGATIVE
SARS Coronavirus 2 by RT PCR: NEGATIVE

## 2024-01-24 NOTE — ED Triage Notes (Signed)
 Pt reports possible fever some sob and fatigued. Pts family sick

## 2024-01-25 ENCOUNTER — Emergency Department
Admission: EM | Admit: 2024-01-25 | Discharge: 2024-01-25 | Disposition: A | Discharge status: Home / Self Care | Attending: Emergency Medicine | Admitting: Emergency Medicine

## 2024-01-25 DIAGNOSIS — R6889 Other general symptoms and signs: Secondary | ICD-10-CM

## 2024-01-25 MED ORDER — IBUPROFEN 800 MG PO TABS
800.0000 mg | ORAL_TABLET | Freq: Once | ORAL | Status: AC
  Administered 2024-01-25: 800 mg via ORAL
  Filled 2024-01-25: qty 1

## 2024-01-25 NOTE — Discharge Instructions (Signed)
 You may alternate Tylenol 1000 mg every 6 hours as needed for fever and pain and ibuprofen 800 mg every 6-8 hours as needed for fever and pain. Please rest and drink plenty of fluids. This is a viral illness causing your symptoms. You do not need antibiotics for a virus. You may use over-the-counter nasal saline spray and Afrin nasal saline spray as needed for nasal congestion. Please do not use Afrin for more than 3 days in a row. You may use guaifenesin and dextromethorphan as needed for cough.  You may use lozenges and Chloraseptic spray to help with sore throat.  Warm salt water gargles can also help with sore throat.  You may use over-the-counter Unisom (doxyalamine) or Benadryl (diphenhydramine) to help with sleep.  Please note that some combination medicines such as DayQuil and NyQuil have multiple medications in them.  Please make sure you look at all labels to ensure that you are not taking too much of any one particular medication.  Symptoms from a virus may take 7-14 days to run its course.  You may also use honey and salt water gargles to help with sore throat, cough, postnasal drainage.  The flu is treated like any other virus with supportive measures as listed above. At this time you are outside the treatment window for Tamiflu or Xofluza. These medications have to be taken within the first 48 hours of symptoms.  Tamiflu has many side effects including nausea, vomiting and diarrhea.

## 2024-01-25 NOTE — ED Provider Notes (Signed)
 "  [facility] Provider Note    Event Date/Time   First MD Initiated Contact with Patient 01/25/24 0239     (approximate)   History   Fatigue   HPI  Rachel Baird is a 44 y.o. female with history of hypothyroidism, depression who presents to the emergency department with not feeling well for the past couple of days.  States she has had bodyaches, cough, congestion, shortness of breath.  No vomiting or diarrhea.  Both of her sons just tested positive for influenza B.   History provided by patient.    Past Medical History:  Diagnosis Date   Depression    self d/ced zoloft  age 26   Headache    Hypothyroidism    d/ced synthroid age 46; had normal TFTs 08/2014   Vaginal Pap smear, abnormal     Past Surgical History:  Procedure Laterality Date   CHOLECYSTECTOMY     MINI-LAPAROTOMY W/ TUBAL LIGATION  01/30/2014   TUBAL LIGATION Bilateral 01/31/2015   Procedure: BILATERAL TUBAL LIGATION;  Surgeon: Bebe Furry, MD;  Location: [facility] ORS;  Service: Gynecology;  Laterality: Bilateral;    MEDICATIONS:  Prior to Admission medications  Medication Sig Start Date End Date Taking? Authorizing Provider  benzonatate  (TESSALON ) 100 MG capsule Take 2 capsules (200 mg total) by mouth every 8 (eight) hours. 02/14/22   Bernardino Ditch, NP  gabapentin (NEURONTIN) 600 MG tablet Take 1,200 mg by mouth 2 (two) times daily. 02/13/22   [provider]  hydrALAZINE  (APRESOLINE ) 10 MG tablet Take 1 tablet (10 mg total) by mouth 2 (two) times daily. 02/02/15   Lynda Bradley, CNM  ibuprofen  (ADVIL ,MOTRIN ) 600 MG tablet Take 1 tablet (600 mg total) by mouth every 8 (eight) hours as needed. 08/04/15   Claudene Tanda POUR, PA-C  ipratropium (ATROVENT ) 0.06 % nasal spray Place 2 sprays into both nostrils 4 (four) times daily. 02/14/22   Bernardino Ditch, NP  methocarbamol  (ROBAXIN -750) 750 MG tablet Take 1 tablet (750 mg total) by mouth 4 (four) times daily. 08/04/15   Claudene Tanda POUR, PA-C   oxyCODONE -acetaminophen  (PERCOCET/ROXICET) 5-325 MG tablet Take 1-2 tablets by mouth every 6 (six) hours as needed for severe pain. 02/02/15   Lynda Bradley, CNM  Prenatal Vit-Fe Fumarate-FA (PRENATAL MULTIVITAMIN) TABS tablet Take 1 tablet by mouth daily at 12 noon.    [provider]  promethazine -dextromethorphan (PROMETHAZINE -DM) 6.25-15 MG/5ML syrup Take 5 mLs by mouth 4 (four) times daily as needed. 02/14/22   Bernardino Ditch, NP  sertraline  (ZOLOFT ) 100 MG tablet Take 1 tablet (100 mg total) by mouth daily. 02/02/15   Lynda Bradley, CNM  SUBOXONE 8-2 MG FILM Place under the tongue 3 (three) times daily. 01/26/22   [provider]  traMADol  (ULTRAM ) 50 MG tablet Take 1 tablet (50 mg total) by mouth every 6 (six) hours as needed. 10/21/15   Charlann Isaiah SQUIBB, MD  traMADol  (ULTRAM ) 50 MG tablet Take 1 tablet (50 mg total) by mouth every 6 (six) hours as needed. 06/13/16   Charlann Isaiah SQUIBB, MD    Physical Exam   Triage Vital Signs: ED Triage Vitals  Encounter Vitals Group     BP 01/24/24 2253 104/66     Girls Systolic BP Percentile --      Girls Diastolic BP Percentile --      Boys Systolic BP Percentile --      Boys Diastolic BP Percentile --      Pulse Rate 01/24/24 2253 (!) 108  Resp 01/24/24 2253 20     Temp 01/24/24 2253 98.5 F (36.9 C)     Temp src --      SpO2 01/24/24 2253 98 %     Weight 01/24/24 2251 167 lb (75.8 kg)     Height 01/24/24 2251 5' 6 (1.676 m)     Head Circumference --      Peak Flow --      Pain Score 01/24/24 2250 1     Pain Loc --      Pain Education --      Exclude from Growth Chart --     Most recent vital signs: Vitals:   01/24/24 2253  BP: 104/66  Pulse: (!) 108  Resp: 20  Temp: 98.5 F (36.9 C)  SpO2: 98%    CONSTITUTIONAL: Alert, responds appropriately to questions. Well-appearing; well-nourished HEAD: Normocephalic, atraumatic EYES: Conjunctivae clear, pupils appear equal, sclera nonicteric ENT: normal nose;  moist mucous membranes; No pharyngeal erythema or petechiae, no tonsillar hypertrophy or exudate, no uvular deviation, no unilateral swelling in posterior oropharynx, no trismus or drooling, no muffled voice, normal phonation, no stridor, airway patent. NECK: Supple, normal ROM CARD: RRR; S1 and S2 appreciated RESP: Normal chest excursion without splinting or tachypnea; breath sounds clear and equal bilaterally; no wheezes, no rhonchi, no rales, no hypoxia or respiratory distress, speaking full sentences ABD/GI: Non-distended; soft, non-tender, no rebound, no guarding, no peritoneal signs BACK: The back appears normal EXT: Normal ROM in all joints; no deformity noted, no edema SKIN: Normal color for age and race; warm; no rash on exposed skin NEURO: Moves all extremities equally, normal speech PSYCH: The patient's mood and manner are appropriate.   ED Results / Procedures / Treatments   LABS: (all labs ordered are listed, but only abnormal results are displayed) Labs Reviewed  RESP PANEL BY RT-PCR (RSV, FLU A&B, COVID)  [id number]     EKG:  EKG Interpretation Date/Time:    Ventricular Rate:    PR Interval:    QRS Duration:    QT Interval:    QTC Calculation:   R Axis:      Text Interpretation:           RADIOLOGY: My personal review and interpretation of imaging:    I have personally reviewed all radiology reports.   No results found.   PROCEDURES:  Critical Care performed:    CRITICAL CARE Performed by: Josette Sink   Total critical care time: 0 minutes  Critical care time was exclusive of separately billable procedures and treating other patients.  Critical care was necessary to treat or prevent imminent or life-threatening deterioration.  Critical care was time spent personally by me on the following activities: development of treatment plan with patient and/or surrogate as well as nursing, discussions with consultants, evaluation of patient's response to  treatment, examination of patient, obtaining history from patient or surrogate, ordering and performing treatments and interventions, ordering and review of laboratory studies, ordering and review of radiographic studies, pulse oximetry and re-evaluation of patient's condition.   Procedures    IMPRESSION / MDM / ASSESSMENT AND PLAN / ED COURSE  I reviewed the triage vital signs and the nursing notes.    Patient here for flulike symptoms.  Sons positive for flu B.    DIFFERENTIAL DIAGNOSIS (includes but not limited to):   Influenza, other viral URI, pneumonia, doubt ACS, PE, dissection, pneumothorax, CHF   Patient's presentation is most consistent with acute complicated illness / injury  requiring diagnostic workup.   PLAN: Patient's respiratory viral panel today is negative however both of her sons here have tested positive for influenza B.  Discussed with patient that she likely also has influenza B.  Discussed risk and benefits of antivirals.  She declines Tamiflu at this time given limited benefit with significant side effects.  Discussed supportive care and return precautions.  She has no hypoxia, respiratory distress, increased work of breathing.  Lungs are clear to auscultation.  Well-appearing, nontoxic, well-hydrated.  I feel she is safe for discharge home.   MEDICATIONS GIVEN IN ED: Medications  ibuprofen  (ADVIL ) tablet 800 mg (800 mg Oral Given 01/25/24 0350)     ED COURSE:  At this time, I do not feel there is any life-threatening condition present. I reviewed all nursing notes, vitals, pertinent previous records.  All lab and urine results, EKGs, imaging ordered have been independently reviewed and interpreted by myself.  I reviewed all available radiology reports from any imaging ordered this visit.  Based on my assessment, I feel the patient is safe to be discharged home without further emergent workup and can continue workup as an outpatient as needed. Discussed all  findings, treatment plan as well as usual and customary return precautions.  They verbalize understanding and are comfortable with this plan.  Outpatient follow-up has been provided as needed.  All questions have been answered.    CONSULTS:  none   OUTSIDE RECORDS REVIEWED: Reviewed previous OB/GYN notes.       FINAL CLINICAL IMPRESSION(S) / ED DIAGNOSES   Final diagnoses:  Flu-like symptoms     Rx / DC Orders   ED Discharge Orders     None        Note:  This document was prepared using Dragon voice recognition software and may include unintentional dictation errors.   Ortha Metts, Josette SAILOR, DO 01/25/24 [phone]  "
